# Patient Record
Sex: Female | Born: 1975 | Race: White | Hispanic: No | Marital: Married | Smoking: Former smoker
Health system: Southern US, Community
[De-identification: ages and names within clinical notes are randomized; demographics above are authoritative.]

## PROBLEM LIST (undated history)

## (undated) DIAGNOSIS — O10919 Unspecified pre-existing hypertension complicating pregnancy, unspecified trimester: Secondary | ICD-10-CM

## (undated) DIAGNOSIS — R87629 Unspecified abnormal cytological findings in specimens from vagina: Secondary | ICD-10-CM

## (undated) DIAGNOSIS — Z8619 Personal history of other infectious and parasitic diseases: Secondary | ICD-10-CM

## (undated) HISTORY — PX: KNEE SURGERY: SHX244

## (undated) HISTORY — DX: Unspecified abnormal cytological findings in specimens from vagina: R87.629

## (undated) HISTORY — DX: Personal history of other infectious and parasitic diseases: Z86.19

## (undated) HISTORY — PX: OTHER SURGICAL HISTORY: SHX169

---

## 1999-12-12 ENCOUNTER — Other Ambulatory Visit: Admission: RE | Admit: 1999-12-12 | Discharge: 1999-12-12 | Payer: Self-pay | Admitting: Obstetrics and Gynecology

## 2000-01-16 ENCOUNTER — Other Ambulatory Visit: Admission: RE | Admit: 2000-01-16 | Discharge: 2000-01-16 | Payer: Self-pay | Admitting: Obstetrics and Gynecology

## 2000-01-16 ENCOUNTER — Encounter (INDEPENDENT_AMBULATORY_CARE_PROVIDER_SITE_OTHER): Payer: Self-pay | Admitting: Specialist

## 2000-07-10 ENCOUNTER — Encounter (INDEPENDENT_AMBULATORY_CARE_PROVIDER_SITE_OTHER): Payer: Self-pay

## 2000-07-10 ENCOUNTER — Other Ambulatory Visit: Admission: RE | Admit: 2000-07-10 | Discharge: 2000-07-10 | Payer: Self-pay | Admitting: Obstetrics and Gynecology

## 2000-09-24 ENCOUNTER — Emergency Department (HOSPITAL_COMMUNITY): Admission: EM | Admit: 2000-09-24 | Discharge: 2000-09-25 | Payer: Self-pay | Admitting: Emergency Medicine

## 2000-09-25 ENCOUNTER — Encounter: Payer: Self-pay | Admitting: Emergency Medicine

## 2001-01-13 ENCOUNTER — Other Ambulatory Visit: Admission: RE | Admit: 2001-01-13 | Discharge: 2001-01-13 | Payer: Self-pay | Admitting: Obstetrics and Gynecology

## 2001-08-18 ENCOUNTER — Other Ambulatory Visit: Admission: RE | Admit: 2001-08-18 | Discharge: 2001-08-18 | Payer: Self-pay | Admitting: Obstetrics and Gynecology

## 2001-10-01 HISTORY — PX: GYNECOLOGIC CRYOSURGERY: SHX857

## 2002-01-22 ENCOUNTER — Other Ambulatory Visit: Admission: RE | Admit: 2002-01-22 | Discharge: 2002-01-22 | Payer: Self-pay | Admitting: Obstetrics and Gynecology

## 2003-04-30 ENCOUNTER — Other Ambulatory Visit: Admission: RE | Admit: 2003-04-30 | Discharge: 2003-04-30 | Payer: Self-pay | Admitting: Obstetrics and Gynecology

## 2004-05-05 ENCOUNTER — Other Ambulatory Visit: Admission: RE | Admit: 2004-05-05 | Discharge: 2004-05-05 | Payer: Self-pay | Admitting: Obstetrics and Gynecology

## 2005-01-19 ENCOUNTER — Encounter: Admission: RE | Admit: 2005-01-19 | Discharge: 2005-01-19 | Payer: Self-pay | Admitting: Obstetrics and Gynecology

## 2005-02-16 ENCOUNTER — Encounter: Admission: RE | Admit: 2005-02-16 | Discharge: 2005-02-16 | Payer: Self-pay | Admitting: Obstetrics and Gynecology

## 2005-07-19 ENCOUNTER — Other Ambulatory Visit: Admission: RE | Admit: 2005-07-19 | Discharge: 2005-07-19 | Payer: Self-pay | Admitting: Obstetrics and Gynecology

## 2005-10-25 ENCOUNTER — Other Ambulatory Visit: Admission: RE | Admit: 2005-10-25 | Discharge: 2005-10-25 | Payer: Self-pay | Admitting: Obstetrics and Gynecology

## 2010-07-17 ENCOUNTER — Ambulatory Visit: Payer: Self-pay | Admitting: Gynecology

## 2010-07-17 ENCOUNTER — Other Ambulatory Visit: Admission: RE | Admit: 2010-07-17 | Discharge: 2010-07-17 | Payer: Self-pay | Admitting: Gynecology

## 2010-12-28 ENCOUNTER — Ambulatory Visit: Payer: Self-pay | Admitting: Gynecology

## 2011-07-30 ENCOUNTER — Ambulatory Visit (INDEPENDENT_AMBULATORY_CARE_PROVIDER_SITE_OTHER): Payer: BC Managed Care – PPO | Admitting: Gynecology

## 2011-07-30 ENCOUNTER — Encounter: Payer: Self-pay | Admitting: Gynecology

## 2011-07-30 ENCOUNTER — Other Ambulatory Visit (HOSPITAL_COMMUNITY)
Admission: RE | Admit: 2011-07-30 | Discharge: 2011-07-30 | Disposition: A | Discharge status: Home / Self Care | Payer: BC Managed Care – PPO | Source: Ambulatory Visit | Attending: Gynecology | Admitting: Gynecology

## 2011-07-30 VITALS — BP 122/78 | Ht 64.0 in | Wt 138.0 lb

## 2011-07-30 DIAGNOSIS — Z01419 Encounter for gynecological examination (general) (routine) without abnormal findings: Secondary | ICD-10-CM

## 2011-07-30 DIAGNOSIS — Z131 Encounter for screening for diabetes mellitus: Secondary | ICD-10-CM

## 2011-07-30 DIAGNOSIS — Z3041 Encounter for surveillance of contraceptive pills: Secondary | ICD-10-CM

## 2011-07-30 DIAGNOSIS — Z13220 Encounter for screening for lipoid disorders: Secondary | ICD-10-CM

## 2011-07-30 DIAGNOSIS — R823 Hemoglobinuria: Secondary | ICD-10-CM

## 2011-07-30 MED ORDER — DESOGESTREL-ETHINYL ESTRADIOL 0.15-0.02/0.01 MG (21/5) PO TABS: 1.0000 | ORAL_TABLET | Freq: Every day | ORAL | Status: DC

## 2011-07-30 NOTE — Progress Notes (Signed)
Mary Buchanan [birth year] [id number]        35 y.o.  for annual exam.  Doing well without complaints.  Past medical history,surgical history, medications, allergies, family history and social history were all reviewed and documented in the EPIC chart. ROS:  Was performed and pertinent positives and negatives are included in the history.  Exam: chaperone present Filed Vitals:   07/30/11 1015  BP: 122/78   General appearance  Normal Skin grossly normal Head/Neck normal with no cervical or supraclavicular adenopathy thyroid normal, unable to palpate prior noted nodule Lungs  clear Cardiac RR, without RMG Abdominal  soft, nontender, without masses, organomegaly or hernia Breasts  examined lying and sitting without masses, retractions, discharge or axillary adenopathy.  Right nipple dimpled as always has been Pelvic  Ext/BUS/vagina  normal   Cervix  normal  Pap done  Uterus  anteverted, normal size, shape and contour, midline and mobile nontender   Adnexa  Without masses or tenderness    Anus and perineum  normal   Rectovaginal  normal sphincter tone without palpated masses or tenderness.    Assessment/Plan:  35 y.o. female for annual exam.    1. History thyroid nodule. Thyroid exam is normal today am unable to palpate the prior nodule. She is being followed actively by endocrinology and will continue to see them. 2. Right nipple dimple. Patient notes that her right nipple has always been difficult and everts normally.  We'll continue with SBE monthly and report any changes. 3. Drospirenone BCPs. I again discussed increased risk of thrombosis with smoking particularly. Her husband plans vasectomy, options to switch pills now versus continuing through the vasectomy was discussed. Patient accepts the risk of stroke heart attack DVT and I refilled her times a year. 4. Pap smear. 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 Although not previously noted she said that she did have  cryo-surgery. Pap smear last year was normal I did another Pap smear this year. Less frequent screening options were reviewed and assuming this Pap smear is normal minimal plan on a less frequent screening interval.  Baseline labs CBC lipid profile glucose urinalysis were ordered. Assuming she continues well from a gynecologic standpoint she'll see me in a year sooner as needed.    Dara Lords MD, 10:48 AM 07/30/2011

## 2011-08-02 ENCOUNTER — Other Ambulatory Visit: Payer: Self-pay | Admitting: Gynecology

## 2011-09-11 ENCOUNTER — Telehealth: Payer: Self-pay | Admitting: *Deleted

## 2011-09-11 NOTE — Telephone Encounter (Signed)
Patient called to say that she has had 2 migraine headaches in the last 5 days that has caused her to be sick and sort of get blurred vision during that time and last about 30 min.  Does not have a PCP so she wanted to get your advice on who to see? Establish with PCP? Has been on generic Mircette for sometime now, but the last two months they have changed the packaging with another manufacturer.  Could this maybe be part of why she is experiencing these now? Please advise

## 2011-09-11 NOTE — Telephone Encounter (Signed)
I would recommend an appointment with a neurologist like Dr. Vela Prose.

## 2011-09-11 NOTE — Telephone Encounter (Signed)
Patient informed.  She said that she will think about doing the referral and talk to husband and let us know if she decides to go ahead with calling for the referral.

## 2011-10-03 ENCOUNTER — Other Ambulatory Visit: Payer: Self-pay | Admitting: Neurology

## 2011-10-03 DIAGNOSIS — G4453 Primary thunderclap headache: Secondary | ICD-10-CM

## 2011-10-17 ENCOUNTER — Other Ambulatory Visit: Payer: Self-pay | Admitting: Neurology

## 2011-10-17 DIAGNOSIS — G4453 Primary thunderclap headache: Secondary | ICD-10-CM

## 2011-10-22 ENCOUNTER — Other Ambulatory Visit: Payer: BC Managed Care – PPO

## 2012-06-27 ENCOUNTER — Other Ambulatory Visit: Payer: Self-pay | Admitting: Gynecology

## 2012-08-07 ENCOUNTER — Encounter: Payer: Self-pay | Admitting: Gynecology

## 2012-08-07 ENCOUNTER — Ambulatory Visit (INDEPENDENT_AMBULATORY_CARE_PROVIDER_SITE_OTHER): Payer: BC Managed Care – PPO | Admitting: Gynecology

## 2012-08-07 VITALS — BP 110/70 | Ht 64.0 in | Wt 134.0 lb

## 2012-08-07 DIAGNOSIS — E049 Nontoxic goiter, unspecified: Secondary | ICD-10-CM

## 2012-08-07 DIAGNOSIS — Z01419 Encounter for gynecological examination (general) (routine) without abnormal findings: Secondary | ICD-10-CM

## 2012-08-07 DIAGNOSIS — Z131 Encounter for screening for diabetes mellitus: Secondary | ICD-10-CM

## 2012-08-07 DIAGNOSIS — Z309 Encounter for contraceptive management, unspecified: Secondary | ICD-10-CM

## 2012-08-07 LAB — CBC WITH DIFFERENTIAL/PLATELET
Hemoglobin: 14.1 g/dL (ref 12.0–15.0)
Lymphs Abs: 2.1 10*3/uL (ref 0.7–4.0)
Monocytes Relative: 6 % (ref 3–12)
Neutro Abs: 5.4 10*3/uL (ref 1.7–7.7)
Neutrophils Relative %: 66 % (ref 43–77)
RBC: 4.41 MIL/uL (ref 3.87–5.11)
WBC: 8.1 10*3/uL (ref 4.0–10.5)

## 2012-08-07 MED ORDER — DESOGESTREL-ETHINYL ESTRADIOL 0.15-0.02/0.01 MG (21/5) PO TABS: 1.0000 | ORAL_TABLET | Freq: Every day | ORAL | Status: DC

## 2012-08-07 NOTE — Patient Instructions (Signed)
Consider Stop Smoking.  Help is available at [facility]'s smoking cessation program @ [email or web address] or 336-832-0838. OR 1-800-QUIT-NOW (1-800-784-8669) for free smoking cessation counseling.  Smokefree.gov (http://www.smokefree.gov) provides free, accurate, evidence-based information and professional assistance to help support the immediate and long-term needs of people trying to quit smoking.    Smoking Hazards Smoking cigarettes is extremely bad for your health. Tobacco smoke has over 200 known poisons in it. There are over 60 chemicals in tobacco smoke that cause cancer. Some of the chemicals found in cigarette smoke include:  Cyanide.  Benzene.  Formaldehyde.  Methanol (wood alcohol).  Acetylene (fuel used in welding torches).  Ammonia.  Cigarette smoke also contains the poisonous gases nitrogen oxide and carbon monoxide.  Cigarette smokers have an increased risk of many serious medical problems, including: Lung cancer.  Lung disease (such as pneumonia, bronchitis, and emphysema).  Heart attack and chest pain due to the heart not getting enough oxygen (angina).  Heart disease and peripheral blood vessel disease.  Hypertension.  Stroke.  Oral cancer (cancer of the lip, mouth, or voice box).  Bladder cancer.  Pancreatic cancer.  Cervical cancer.  Pregnancy complications, including premature birth.  Low birthweight babies.  Early menopause.  Lower estrogen level for women.  Infertility.  Facial wrinkles.  Blindness.  Increased risk of broken bones (fractures).  Senile dementia.  Stillbirths and smaller newborn babies, birth defects, and genetic damage to sperm.  Stomach ulcers and internal bleeding.  Children of smokers have an increased risk of the following, because of secondhand smoke exposure:  Sudden infant death syndrome (SIDS).  Respiratory infections.  Lung cancer.  Heart disease.  Ear infections.  Smoking causes approximately: 90% of all lung cancer  deaths in men.  80% of all lung cancer deaths in women.  90% of deaths from chronic obstructive lung disease.  Compared with nonsmokers, smoking increases the risk of: Coronary heart disease by 2 to 4 times.  Stroke by 2 to 4 times.  Men developing lung cancer by 23 times.  Women developing lung cancer by 13 times.  Dying from chronic obstructive lung diseases by 12 times.  Someone who smokes 2 packs a day loses about 8 years of his or her life. Even smoking lightly shortens your life expectancy by several years. You can greatly reduce the risk of medical problems for you and your family by stopping now. Smoking is the most preventable cause of death and disease in our society. Within days of quitting smoking, your circulation returns to normal, you decrease the risk of having a heart attack, and your lung capacity improves. There may be some increased phlegm in the first few days after quitting, and it may take months for your lungs to clear up completely. Quitting for 10 years cuts your lung cancer risk to almost that of a nonsmoker. WHY IS SMOKING ADDICTIVE? Nicotine is the chemical agent in tobacco that is capable of causing addiction or dependence.  When you smoke and inhale, nicotine is absorbed rapidly into the bloodstream through your lungs. Nicotine absorbed through the lungs is capable of creating a powerful addiction. Both inhaled and non-inhaled nicotine may be addictive.  Addiction studies of cigarettes and spit tobacco show that addiction to nicotine occurs mainly during the teen years, when young people begin using tobacco products.  WHAT ARE THE BENEFITS OF QUITTING?  There are many health benefits to quitting smoking.  Likelihood of developing cancer and heart disease decreases. Health improvements are seen almost immediately.    Blood pressure, pulse rate, and breathing patterns start returning to normal soon after quitting.  People who quit may see an improvement in their overall  quality of life.  Some people choose to quit all at once. Other options include nicotine replacement products, such as patches, gum, and nasal sprays. Do not use these products without first checking with your caregiver. QUITTING SMOKING It is not easy to quit smoking. Nicotine is addicting, and longtime habits are hard to change. To start, you can write down all your reasons for quitting, tell your family and friends you want to quit, and ask for their help. Throw your cigarettes away, chew gum or cinnamon sticks, keep your hands busy, and drink extra water or juice. Go for walks and practice deep breathing to relax. Think of all the money you are saving: around $1,000 a year, for the average pack-a-day smoker. Nicotine patches and gum have been shown to improve success at efforts to stop smoking. Zyban (bupropion) is an anti-depressant drug that can be prescribed to reduce nicotine withdrawal symptoms and to suppress the urge to smoke. Smoking is an addiction with both physical and psychological effects. Joining a stop-smoking support group can help you cope with the emotional issues. For more information and advice on programs to stop smoking, call your doctor, your local hospital, or these organizations: American Lung Association - 1-800-LUNGUSA   Smoking Cessation  This document explains the best ways for you to quit smoking and new treatments to help. It lists new medicines that can double or triple your chances of quitting and quitting for good. It also considers ways to avoid relapses and concerns you may have about quitting, including weight gain. NICOTINE: A POWERFUL ADDICTION If you have tried to quit smoking, you know how hard it can be. It is hard because nicotine is a very addictive drug. For some people, it can be as addictive as heroin or cocaine. Usually, people make 2 or 3 tries, or more, before finally being able to quit. Each time you try to quit, you can learn about what helps and  what hurts. Quitting takes hard work and a lot of effort, but you can quit smoking. QUITTING SMOKING IS ONE OF THE MOST IMPORTANT THINGS YOU WILL EVER DO.  You will live longer, feel better, and live better.   The impact on your body of quitting smoking is felt almost immediately:   Within 20 minutes, blood pressure decreases. Pulse returns to its normal level.   After 8 hours, carbon monoxide levels in the blood return to normal. Oxygen level increases.   After 24 hours, chance of heart attack starts to decrease. Breath, hair, and body stop smelling like smoke.   After 48 hours, damaged nerve endings begin to recover. Sense of taste and smell improve.   After 72 hours, the body is virtually free of nicotine. Bronchial tubes relax and breathing becomes easier.   After 2 to 12 weeks, lungs can hold more air. Exercise becomes easier and circulation improves.   Quitting will reduce your risk of having a heart attack, stroke, cancer, or lung disease:   After 1 year, the risk of coronary heart disease is cut in half.   After 5 years, the risk of stroke falls to the same as a nonsmoker.   After 10 years, the risk of lung cancer is cut in half and the risk of other cancers decreases significantly.   After 15 years, the risk of coronary heart disease drops, usually   to the level of a nonsmoker.   If you are pregnant, quitting smoking will improve your chances of having a healthy baby.   The people you live with, especially your children, will be healthier.   You will have extra money to spend on things other than cigarettes.  FIVE KEYS TO QUITTING Studies have shown that these 5 steps will help you quit smoking and quit for good. You have the best chances of quitting if you use them together: 1. Get ready.  2. Get support and encouragement.  3. Learn new skills and behaviors.  4. Get medicine to reduce your nicotine addiction and use it correctly.  5. Be prepared for relapse or  difficult situations. Be determined to continue trying to quit, even if you do not succeed at first.  1. GET READY  Set a quit date.   Change your environment.   Get rid of ALL cigarettes, ashtrays, matches, and lighters in your home, car, and place of work.   Do not let people smoke in your home.   Review your past attempts to quit. Think about what worked and what did not.   Once you quit, do not smoke. NOT EVEN A PUFF!  2. GET SUPPORT AND ENCOURAGEMENT Studies have shown that you have a better chance of being successful if you have help. You can get support in many ways.  Tell your family, friends, and coworkers that you are going to quit and need their support. Ask them not to smoke around you.   Talk to your caregivers (doctor, dentist, nurse, pharmacist, psychologist, and/or smoking counselor).   Get individual, group, or telephone counseling and support. The more counseling you have, the better your chances are of quitting. Programs are available at local hospitals and health centers. Call your local health department for information about programs in your area.   Spiritual beliefs and practices may help some smokers quit.   Quit meters are small computer programs online or downloadable that keep track of quit statistics, such as amount of "quit-time," cigarettes not smoked, and money saved.   Many smokers find one or more of the many self-help books available useful in helping them quit and stay off tobacco.  3. LEARN NEW SKILLS AND BEHAVIORS  Try to distract yourself from urges to smoke. Talk to someone, go for a walk, or occupy your time with a task.   When you first try to quit, change your routine. Take a different route to work. Drink tea instead of coffee. Eat breakfast in a different place.   Do something to reduce your stress. Take a hot bath, exercise, or read a book.   Plan something enjoyable to do every day. Reward yourself for not smoking.   Explore  interactive web-based programs that specialize in helping you quit.  4. GET MEDICINE AND USE IT CORRECTLY Medicines can help you stop smoking and decrease the urge to smoke. Combining medicine with the above behavioral methods and support can quadruple your chances of successfully quitting smoking. The [place] Food and Drug Administration (FDA) has approved 7 medicines to help you quit smoking. These medicines fall into 3 categories.  Nicotine replacement therapy (delivers nicotine to your body without the negative effects and risks of smoking):   Nicotine gum: Available over-the-counter.   Nicotine lozenges: Available over-the-counter.   Nicotine inhaler: Available by prescription.   Nicotine nasal spray: Available by prescription.   Nicotine skin patches (transdermal): Available by prescription and over-the-counter.   Antidepressant medicine (  helps people abstain from smoking, but how this works is unknown):   Bupropion sustained-release (SR) tablets: Available by prescription.   Nicotinic receptor partial agonist (simulates the effect of nicotine in your brain):   Varenicline tartrate tablets: Available by prescription.   Ask your caregiver for advice about which medicines to use and how to use them. Carefully read the information on the package.   Everyone who is trying to quit may benefit from using a medicine. If you are pregnant or trying to become pregnant, nursing an infant, you are under age 18, or you smoke fewer than 10 cigarettes per day, talk to your caregiver before taking any nicotine replacement medicines.   You should stop using a nicotine replacement product and call your caregiver if you experience nausea, dizziness, weakness, vomiting, fast or irregular heartbeat, mouth problems with the lozenge or gum, or redness or swelling of the skin around the patch that does not go away.   Do not use any other product containing nicotine while using a nicotine replacement  product.   Talk to your caregiver before using these products if you have diabetes, heart disease, asthma, stomach ulcers, you had a recent heart attack, you have high blood pressure that is not controlled with medicine, a history of irregular heartbeat, or you have been prescribed medicine to help you quit smoking.  5. BE PREPARED FOR RELAPSE OR DIFFICULT SITUATIONS  Most relapses occur within the first 3 months after quitting. Do not be discouraged if you start smoking again. Remember, most people try several times before they finally quit.   You may have symptoms of withdrawal because your body is used to nicotine. You may crave cigarettes, be irritable, feel very hungry, cough often, get headaches, or have difficulty concentrating.   The withdrawal symptoms are only temporary. They are strongest when you first quit, but they will go away within 10 to 14 days.  Here are some difficult situations to watch for:  Alcohol. Avoid drinking alcohol. Drinking lowers your chances of successfully quitting.   Caffeine. Try to reduce the amount of caffeine you consume. It also lowers your chances of successfully quitting.   Other smokers. Being around smoking can make you want to smoke. Avoid smokers.   Weight gain. Many smokers will gain weight when they quit, usually less than 10 pounds. Eat a healthy diet and stay active. Do not let weight gain distract you from your main goal, quitting smoking. Some medicines that help you quit smoking may also help delay weight gain. You can always lose the weight gained after you quit.   Bad mood or depression. There are a lot of ways to improve your mood other than smoking.  If you are having problems with any of these situations, talk to your caregiver. SPECIAL SITUATIONS AND CONDITIONS Studies suggest that everyone can quit smoking. Your situation or condition can give you a special reason to quit.  Pregnant women/new mothers: By quitting, you protect your  baby's health and your own.   Hospitalized patients: By quitting, you reduce health problems and help healing.   Heart attack patients: By quitting, you reduce your risk of a second heart attack.   Lung, head, and neck cancer patients: By quitting, you reduce your chance of a second cancer.   Parents of children and adolescents: By quitting, you protect your children from illnesses caused by secondhand smoke.  QUESTIONS TO THINK ABOUT Think about the following questions before you try to stop smoking. You may   want to talk about your answers with your caregiver.  Why do you want to quit?   If you tried to quit in the past, what helped and what did not?   What will be the most difficult situations for you after you quit? How will you plan to handle them?   Who can help you through the tough times? Your family? Friends? Caregiver?   What pleasures do you get from smoking? What ways can you still get pleasure if you quit?  Here are some questions to ask your caregiver:  How can you help me to be successful at quitting?   What medicine do you think would be best for me and how should I take it?   What should I do if I need more help?   What is smoking withdrawal like? How can I get information on withdrawal?  Quitting takes hard work and a lot of effort, but you can quit smoking.  

## 2012-08-07 NOTE — Progress Notes (Signed)
Mary Buchanan Mar 01, 1976 [id number]        36 y.o.  G0P0 for annual exam.    Past medical history,surgical history, medications, allergies, family history and social history were all reviewed and documented in the EPIC chart. ROS:  Was performed and pertinent positives and negatives are included in the history.  Exam: Kim assistant Filed Vitals:   08/07/12 1032  BP: 110/70  Height: 5\' 4"  (1.626 m)  Weight: 134 lb (60.782 kg)   General appearance  Normal Skin grossly normal Head/Neck normal with no cervical or supraclavicular adenopathy thyroid normal Lungs  clear Cardiac RR, without RMG Abdominal  soft, nontender, without masses, organomegaly or hernia Breasts  examined lying and sitting without masses, retractions, discharge or axillary adenopathy.  Right nipple dimpled/reverts easily. Pelvic  Ext/BUS/vagina  normal   Cervix  normal   Uterus  anteverted, normal size, shape and contour, midline and mobile nontender   Adnexa  Without masses or tenderness    Anus and perineum  normal   Rectovaginal  normal sphincter tone without palpated masses or tenderness.    Assessment/Plan:  36 y.o. G0P0 female for annual exam.   1. Contraception. Patient on drospirenone containing birth control pill. We've discussed previously I again reviewed with her the risk in general with pills of stroke heart attack DVT and the increased risk with drospirenone containing. She also smokes and is 36. I again reviewed options to include progesterone only, Mirena IUD sterilization. Patient says husband is considering sterilization. She understands accepts risk of thrombosis I refilled her 1 more year. She understands that I will not refill her next year and that we will need to use alternative such as an IUD/progesterone only or hopefully her husband will that his vasectomy. 2. Pap smear. No Pap smear done today. Last Pap smear 2012. 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 We'll plan  every 3-5 year screening. 3. Breast health. Right nipple dimpled as always has been and the vertex easily. Continue with monthly self breast exams. Plan mammogram closer to 42. 4. Stop smoking. I reviewed the need to stop smoking and various strategies. She had tried Chantix but did not like the way she felt. She does understand and agree with the need to stop and will try to do so. 5. History of goiter. Her thyroid exam is normal. She's going to check with her endocrinologist to see what if any follow up she needs. I did check thyroid functions today. 6. Health maintenance.  Baseline CBC urinalysis and glucose done with thyroid functions. Lipid profile 2012 normal and not repeated.  Follow up one year, sooner as needed.    Dara Lords MD, 11:33 AM 08/07/2012

## 2012-08-08 LAB — T4, FREE: Free T4: 1.46 ng/dL (ref 0.80–1.80)

## 2012-08-09 LAB — URINALYSIS W MICROSCOPIC + REFLEX CULTURE
Bilirubin Urine: NEGATIVE
Glucose, UA: NEGATIVE mg/dL
Protein, ur: NEGATIVE mg/dL
Squamous Epithelial / LPF: NONE SEEN

## 2012-08-21 ENCOUNTER — Other Ambulatory Visit: Payer: Self-pay | Admitting: Gynecology

## 2013-07-23 ENCOUNTER — Other Ambulatory Visit: Payer: Self-pay | Admitting: Gynecology

## 2013-08-12 ENCOUNTER — Ambulatory Visit (INDEPENDENT_AMBULATORY_CARE_PROVIDER_SITE_OTHER): Payer: BC Managed Care – PPO | Admitting: Gynecology

## 2013-08-12 ENCOUNTER — Encounter: Payer: Self-pay | Admitting: Gynecology

## 2013-08-12 VITALS — BP 110/70 | Ht 63.0 in | Wt 127.0 lb

## 2013-08-12 DIAGNOSIS — Z716 Tobacco abuse counseling: Secondary | ICD-10-CM

## 2013-08-12 DIAGNOSIS — L989 Disorder of the skin and subcutaneous tissue, unspecified: Secondary | ICD-10-CM

## 2013-08-12 DIAGNOSIS — Z7189 Other specified counseling: Secondary | ICD-10-CM

## 2013-08-12 DIAGNOSIS — Z309 Encounter for contraceptive management, unspecified: Secondary | ICD-10-CM

## 2013-08-12 DIAGNOSIS — Z01419 Encounter for gynecological examination (general) (routine) without abnormal findings: Secondary | ICD-10-CM

## 2013-08-12 LAB — CBC WITH DIFFERENTIAL/PLATELET
Basophils Absolute: 0 10*3/uL (ref 0.0–0.1)
Basophils Relative: 1 % (ref 0–1)
Eosinophils Absolute: 0.1 10*3/uL (ref 0.0–0.7)
Eosinophils Relative: 1 % (ref 0–5)
HCT: 38.1 % (ref 36.0–46.0)
Hemoglobin: 13.4 g/dL (ref 12.0–15.0)
Lymphocytes Relative: 26 % (ref 12–46)
Lymphs Abs: 1.9 10*3/uL (ref 0.7–4.0)
MCH: 32.1 pg (ref 26.0–34.0)
MCHC: 35.2 g/dL (ref 30.0–36.0)
MCV: 91.4 fL (ref 78.0–100.0)
Monocytes Absolute: 0.6 10*3/uL (ref 0.1–1.0)
Monocytes Relative: 8 % (ref 3–12)
Neutro Abs: 4.7 10*3/uL (ref 1.7–7.7)
Neutrophils Relative %: 64 % (ref 43–77)
Platelets: 241 10*3/uL (ref 150–400)
RBC: 4.17 MIL/uL (ref 3.87–5.11)
RDW: 12.9 % (ref 11.5–15.5)
WBC: 7.3 10*3/uL (ref 4.0–10.5)

## 2013-08-12 LAB — LIPID PANEL
Cholesterol: 104 mg/dL (ref 0–200)
HDL: 82 mg/dL (ref >39)
LDL Cholesterol: 17 mg/dL (ref 0–99)
Triglycerides: 24 mg/dL (ref <150)
VLDL: 5 mg/dL (ref 0–40)

## 2013-08-12 LAB — COMPREHENSIVE METABOLIC PANEL
Alkaline Phosphatase: 43 U/L (ref 39–117)
BUN: 10 mg/dL (ref 6–23)
Creat: 0.74 mg/dL (ref 0.50–1.10)
Glucose, Bld: 83 mg/dL (ref 70–99)
Total Bilirubin: 0.4 mg/dL (ref 0.3–1.2)

## 2013-08-12 LAB — HEPATITIS A ANTIBODY, TOTAL: Hep A Total Ab: NONREACTIVE

## 2013-08-12 MED ORDER — BUPROPION HCL ER (SR) 150 MG PO TB12: 150.0000 mg | ORAL_TABLET | Freq: Every day | ORAL | Status: DC

## 2013-08-12 MED ORDER — DESOGESTREL-ETHINYL ESTRADIOL 0.15-0.02/0.01 MG (21/5) PO TABS: 1.0000 | ORAL_TABLET | Freq: Every day | ORAL | Status: DC

## 2013-08-12 NOTE — Progress Notes (Signed)
Mary Buchanan [birth year] [id number]        37 y.o.  G0P0 for annual exam.  Several issues noted below.  Past medical history,surgical history, problem list, medications, allergies, family history and social history were all reviewed and documented in the EPIC chart.  ROS:  Performed and pertinent positives and negatives are included in the history, assessment and plan .  Exam: Kim assistant Filed Vitals:   08/12/13 0900  BP: 110/70  Height: 5\' 3"  (1.6 m)  Weight: 127 lb (57.607 kg)   General appearance  Normal Skin crusting superficial skin lesion left forearm Head/Neck normal with no cervical or supraclavicular adenopathy thyroid normal Lungs  clear Cardiac RR, without RMG Abdominal  soft, nontender, without masses, organomegaly or hernia Breasts  examined lying and sitting without masses, retractions, discharge or axillary adenopathy. Right nipple dimpled as always Pelvic  Ext/BUS/vagina  normal  Cervix  normal  Uterus  anteverted, normal size, shape and contour, midline and mobile nontender   Adnexa  Without masses or tenderness    Anus and perineum  normal   Rectovaginal  normal sphincter tone without palpated masses or tenderness.    Assessment/Plan:  37 y.o. G0P0 female for annual exam, regular menses, oral contraceptives.   1. Contraceptive management. Patient on desogestrel pill. Age 32 and smoking. Reviewed increased thrombosis risk to include stroke heart attack DVT. Husband planning vasectomy in January. I refilled her time 6 months to allow her to transition through the vasectomy and then she will stop. She clearly understands and accepts the above risks. 2. Smoking cessation. Patient planning to stop smoking and asked about medication assistance. Does not feel she can do it by herself. Had tried Chantix before and had headaches and did not feel well. She asked about Wellbutrin that a friend had tried him was successful. I discussed stop smoking strategies with her to  include the use of Wellbutrin type products. Side effects and risks to include increasing anxiety, depression and suicide ideation discussed. Patient understands accepts and will start with iron 50 mg times several days and then 150 mg twice daily for 3 months. 3. Breast health. Right nipple dimpled as always. SBE monthly reviewed. Screening mammographic recommendations between 37 and 42 discussed. Breast cancer in maternal grandmother at age 12. We'll plan mammogram closer to 42. 4. Crusting lesion left forearm. Present for 3-4 months. Recommended dermatologic evaluation and I discussed possible squamous cell carcinoma with her and she agrees to arrange appointment. 5. Pap smear 2012. No Pap smear done today. History of cryosurgery 2003 with normal Pap smears afterwards. Followup Pap smear next year at three-year intervals. 6. Brother-in-law diagnosed with hepatitis A. He was asymptomatic and this was detected on routine screening comprehensive metabolic panel. Thought to be secondary to eating out at restaurants. We'll screen liver function and hepatitis A antibody in patient as she spends a lot of time with her brother-in-law. 7. Health maintenance. Baseline CBC conference of metabolic panel lipid profile urinalysis hepatitis A antibody screen ordered.  Note: This document was prepared with digital dictation and possible smart phrase technology. Any transcriptional errors that result from this process are unintentional.   Dara Lords MD, 9:30 AM 08/12/2013   \

## 2013-08-12 NOTE — Patient Instructions (Addendum)
Start on Wellbutrin medication once daily for one week and then twice daily for 3 months. Report any increasing anxiety, depression or suicide thoughts. Followup with dermatologist in reference to the skin lesion on your arm. Make sure your husband follows through with the vasectomy.

## 2013-08-13 LAB — URINALYSIS W MICROSCOPIC + REFLEX CULTURE
Bacteria, UA: NONE SEEN
Bilirubin Urine: NEGATIVE
Casts: NONE SEEN
Hgb urine dipstick: NEGATIVE
Ketones, ur: NEGATIVE mg/dL
Nitrite: NEGATIVE
Protein, ur: NEGATIVE mg/dL
Urobilinogen, UA: 0.2 mg/dL (ref 0.0–1.0)
pH: 6 (ref 5.0–8.0)

## 2013-11-19 ENCOUNTER — Ambulatory Visit: Payer: BC Managed Care – PPO | Admitting: Gynecology

## 2013-11-24 ENCOUNTER — Ambulatory Visit: Payer: BC Managed Care – PPO | Admitting: Gynecology

## 2013-11-27 ENCOUNTER — Telehealth: Payer: Self-pay | Admitting: Gynecology

## 2013-11-27 NOTE — Telephone Encounter (Signed)
11/27/13-AS THIS PT IS COMING IN FOR IUD CONSULT I CHECKED HER INS BENEFITS AND IT WOULD BE COVERED AT 100% UNDER PREVENTATIVE SVCS/WL

## 2013-12-10 ENCOUNTER — Encounter: Payer: Self-pay | Admitting: Gynecology

## 2013-12-10 ENCOUNTER — Ambulatory Visit (INDEPENDENT_AMBULATORY_CARE_PROVIDER_SITE_OTHER): Payer: BC Managed Care – PPO | Admitting: Gynecology

## 2013-12-10 DIAGNOSIS — Z309 Encounter for contraceptive management, unspecified: Secondary | ICD-10-CM

## 2013-12-10 NOTE — Progress Notes (Signed)
Patient presents to discuss contraceptive options. She is currently on the oral contraceptives and continues to smoke at age 38. Husband was planning vasectomy but they decided against this. I reviewed contraceptive options to include nexplanon, Depo-Provera, IUD and barrier. The issues of increased thrombosis risk associated with estrogen containing products discussed. She's interested in Mirena IUD. I reviewed the insertional process with her as well as the risks to include infection, either immediate or long-term, uterine perforation or migration requiring surgery to remove, other complications such as pain, hormonal side effects, infertility and possibility of failure with subsequent pregnancy. Patient wants to go ahead and schedule this and will do so when she is on her next menstrual period. Literature on the Mirena IUD was provided.

## 2013-12-10 NOTE — Patient Instructions (Signed)
Followup for scheduled IUD insertion will appointment.  Intrauterine Device Insertion Most often, an intrauterine device (IUD) is inserted into the uterus to prevent pregnancy. There are 2 types of IUDs available:  Copper IUD This type of IUD creates an environment that is not favorable to sperm survival. The mechanism of action of the copper IUD is not known for certain. It can stay in place for 10 years.  Hormone IUD This type of IUD contains the hormone progestin (synthetic progesterone). The progestin thickens the cervical mucus and prevents sperm from entering the uterus, and it also thins the uterine lining. There is no evidence that the hormone IUD prevents implantation. One hormone IUD can stay in place for up to 5 years, and a different hormone IUD can stay in place for up to 3 years. An IUD is the most cost-effective birth control if left in place for the full duration. It may be removed at any time. LET [facility] PROVID[facility] KNOW ABOUT:  Any allergies you have.  All medicines you are taking, including vitamins, herbs, eye drops, creams, and over-the-counter medicines.  Previous problems you or members of your family have had with the use of anesthetics.  Any blood disorders you have.  Previous surgeries you have had.  Possibility of pregnancy.  Medical conditions you have. RISKS AND COMPLICATIONS  Generally, intrauterine device insertion is a safe procedure. However, as with any procedure, complications can occur. Possible complications include:  Accidental puncture (perforation) of the uterus.  Accidental placement of the IUD either in the muscle layer of the uterus (myometrium) or outside the uterus. If this happens, the IUD can be found essentially floating around the bowels and must be taken out surgically.  The IUD may fall out of the uterus (expulsion). This is more common in women who have recently had a child.   Pregnancy in the fallopian tube  (ectopic).  Pelvic inflammatory disease (PID), which is infection of the uterus and fallopian tubes. The risk of PID is slightly increased in the first 20 days after the IUD is placed, but the overall risk is still very low. BEFORE THE PROCEDURE  Schedule the IUD insertion for when you will have your menstrual period or right after, to make sure you are not pregnant. Placement of the IUD is better tolerated shortly after a menstrual cycle.  You may need to take tests or be examined to make sure you are not pregnant.  You may be required to take a pregnancy test.  You may be required to get checked for sexually transmitted infections (STIs) prior to placement. Placing an IUD in someone who has an infection can make the infection worse.  You may be given a pain reliever to take 1 or 2 hours before the procedure.  An exam will be performed to determine the size and position of your uterus.  Ask your health care provider about changing or stopping your regular medicines. PROCEDURE   A tool (speculum) is placed in the vagina. This allows your health care provider to see the lower part of the uterus (cervix).  The cervix is prepped with a medicine that lowers the risk of infection.  You may be given a medicine to numb each side of the cervix (intracervical or paracervical block). This is used to block and control any discomfort with insertion.  A tool (uterine sound) is inserted into the uterus to determine the length of the uterine cavity and the direction the uterus may be tilted.  A slim instrument (IUD inserter) is inserted through the cervical canal and into your uterus.  The IUD is placed in the uterine cavity and the insertion device is removed.  The nylon string that is attached to the IUD and used for eventual IUD removal is trimmed. It is trimmed so that it lays high in the vagina, just outside the cervix. AFTER THE PROCEDURE  You may have bleeding after the procedure. This is  normal. It varies from light spotting for a few days to menstrual-like bleeding.  You may have mild cramping. Document Released: 05/16/2011 Document Revised: 07/08/2013 Document Reviewed: 03/08/2013 [facility] Patient Information 2014 [place].

## 2013-12-11 ENCOUNTER — Other Ambulatory Visit: Payer: Self-pay | Admitting: Gynecology

## 2013-12-11 ENCOUNTER — Telehealth: Payer: Self-pay | Admitting: Gynecology

## 2013-12-11 DIAGNOSIS — Z3049 Encounter for surveillance of other contraceptives: Secondary | ICD-10-CM

## 2013-12-11 MED ORDER — LEVONORGESTREL 20 MCG/24HR IU IUD: INTRAUTERINE_SYSTEM | Freq: Once | INTRAUTERINE | Status: DC

## 2013-12-11 NOTE — Telephone Encounter (Signed)
12/11/13-LM VM for pt that her ins covers the Mirena and insertion at 100%. Pt to call first day of cycle to insert.wl

## 2014-01-07 ENCOUNTER — Other Ambulatory Visit: Payer: Self-pay | Admitting: Gynecology

## 2014-01-07 DIAGNOSIS — Z3049 Encounter for surveillance of other contraceptives: Secondary | ICD-10-CM

## 2014-01-11 ENCOUNTER — Ambulatory Visit (INDEPENDENT_AMBULATORY_CARE_PROVIDER_SITE_OTHER): Payer: BC Managed Care – PPO | Admitting: Gynecology

## 2014-01-11 ENCOUNTER — Encounter: Payer: Self-pay | Admitting: Gynecology

## 2014-01-11 DIAGNOSIS — Z30430 Encounter for insertion of intrauterine contraceptive device: Secondary | ICD-10-CM

## 2014-01-11 DIAGNOSIS — N882 Stricture and stenosis of cervix uteri: Secondary | ICD-10-CM

## 2014-01-11 HISTORY — PX: INTRAUTERINE DEVICE (IUD) INSERTION: SHX5877

## 2014-01-11 NOTE — Patient Instructions (Signed)
Intrauterine Device Insertion   Most often, an intrauterine device (IUD) is inserted into the uterus to prevent pregnancy. There are 2 types of IUDs available:  · Copper IUD This type of IUD creates an environment that is not favorable to sperm survival. The mechanism of action of the copper IUD is not known for certain. It can stay in place for 10 years.  · Hormone IUD This type of IUD contains the hormone progestin (synthetic progesterone). The progestin thickens the cervical mucus and prevents sperm from entering the uterus, and it also thins the uterine lining. There is no evidence that the hormone IUD prevents implantation. One hormone IUD can stay in place for up to 5 years, and a different hormone IUD can stay in place for up to 3 years.  An IUD is the most cost-effective birth control if left in place for the full duration. It may be removed at any time.  LET YOUR HEALTH CARE PROVIDER KNOW ABOUT:  · Any allergies you have.  · All medicines you are taking, including vitamins, herbs, eye drops, creams, and over-the-counter medicines.  · Previous problems you or members of your family have had with the use of anesthetics.  · Any blood disorders you have.  · Previous surgeries you have had.  · Possibility of pregnancy.  · Medical conditions you have.  RISKS AND COMPLICATIONS   Generally, intrauterine device insertion is a safe procedure. However, as with any procedure, complications can occur. Possible complications include:  · Accidental puncture (perforation) of the uterus.  · Accidental placement of the IUD either in the muscle layer of the uterus (myometrium) or outside the uterus. If this happens, the IUD can be found essentially floating around the bowels and must be taken out surgically.  · The IUD may fall out of the uterus (expulsion). This is more common in women who have recently had a child.    · Pregnancy in the fallopian tube (ectopic).  · Pelvic inflammatory disease (PID), which is infection of  the uterus and fallopian tubes. The risk of PID is slightly increased in the first 20 days after the IUD is placed, but the overall risk is still very low.  BEFORE THE PROCEDURE  · Schedule the IUD insertion for when you will have your menstrual period or right after, to make sure you are not pregnant. Placement of the IUD is better tolerated shortly after a menstrual cycle.  · You may need to take tests or be examined to make sure you are not pregnant.  · You may be required to take a pregnancy test.  · You may be required to get checked for sexually transmitted infections (STIs) prior to placement. Placing an IUD in someone who has an infection can make the infection worse.  · You may be given a pain reliever to take 1 or 2 hours before the procedure.  · An exam will be performed to determine the size and position of your uterus.  · Ask your health care provider about changing or stopping your regular medicines.  PROCEDURE   · A tool (speculum) is placed in the vagina. This allows your health care provider to see the lower part of the uterus (cervix).  · The cervix is prepped with a medicine that lowers the risk of infection.  · You may be given a medicine to numb each side of the cervix (intracervical or paracervical block). This is used to block and control any discomfort with insertion.  · A tool (  uterine sound) is inserted into the uterus to determine the length of the uterine cavity and the direction the uterus may be tilted.  · A slim instrument (IUD inserter) is inserted through the cervical canal and into your uterus.  · The IUD is placed in the uterine cavity and the insertion device is removed.  · The nylon string that is attached to the IUD and used for eventual IUD removal is trimmed. It is trimmed so that it lays high in the vagina, just outside the cervix.  AFTER THE PROCEDURE  · You may have bleeding after the procedure. This is normal. It varies from light spotting for a few days to menstrual-like  bleeding.   · You may have mild cramping.  Document Released: 05/16/2011 Document Revised: 07/08/2013 Document Reviewed: 03/08/2013  [employer]® Patient Information ©2014 [employer].

## 2014-01-11 NOTE — Progress Notes (Signed)
Patient presents for Mirena IUD placement. She has read through the booklet, has no contraindications and signed the consent form. She currently is on a normal menses.  I again reviewed the insertional process with her as well as the risks to include infection, either immediate or long-term, uterine perforation or migration requiring surgery to remove, other complications such as pain, hormonal side effects, infertility and possibility of failure with subsequent pregnancy.   Exam with Sherrilyn RistKari assistant Pelvic: External BUS vagina normal. Cervix normal with slight menstrual flow. Uterus anteverted normal size shape contour midline mobile nontender. Adnexa without masses or tenderness.  Procedure: The cervix was cleansed with Betadine, anterior lip grasped with a single-tooth tenaculum, cervix was found to be slightly stenotic and was gently dilated with the gradated disposable dilator. The uterus was sounded but initial attempts to place a Mirena IUD felt resistance at the internal cervical os. Subsequently a paracervical block, 1% lidocaine, total 8 cc was placed and the cervix was dilated to the diameter of the Mirena IUD applier and subsequently the Mirena IUD was placed according to manufacturer's recommendations without difficulty. The strings were trimmed. The patient tolerated well and will follow up in one month for a postinsertional check.  Lot number:  [id number]  Note: This document was prepared with digital dictation and possible smart phrase technology. Any transcriptional errors that result from this process are unintentional.  Dara Lordsimothy P Carleigh Buccieri MD, 12:23 PM 01/11/2014

## 2014-01-13 ENCOUNTER — Encounter: Payer: Self-pay | Admitting: Gynecology

## 2014-02-10 ENCOUNTER — Ambulatory Visit (INDEPENDENT_AMBULATORY_CARE_PROVIDER_SITE_OTHER): Payer: BC Managed Care – PPO | Admitting: Gynecology

## 2014-02-10 ENCOUNTER — Encounter: Payer: Self-pay | Admitting: Gynecology

## 2014-02-10 DIAGNOSIS — Z30431 Encounter for routine checking of intrauterine contraceptive device: Secondary | ICD-10-CM

## 2014-02-10 NOTE — Patient Instructions (Signed)
Followup in November 2015 for annual exam, sooner if any issues.

## 2014-02-10 NOTE — Progress Notes (Signed)
Mary Buchanan 20-Jun-1976 [id number]        38 y.o.  G0P0 IUD followup exam. Has done well with occasional spotting.  Past medical history,surgical history, problem list, medications, allergies, family history and social history were all reviewed and documented in the EPIC chart.  Directed ROS with pertinent positives and negatives documented in the history of present illness/assessment and plan.  Exam: Kim assistant General appearance  Normal Abdomen soft nontender without masses guarding rebound Pelvic external BUS vagina with slight spotting. Cervix normal with IUD strings visualized and appropriate length. Uterus normal size midline mobile nontender. Adnexa without masses or tenderness.  Assessment/Plan:  38 y.o. G0P0 with normal IUD check up. Assuming she does well and she'll see us in November 2015 for annual exam, sooner if any issues.   Note: This document was prepared with digital dictation and possible smart phrase technology. Any transcriptional errors that result from this process are unintentional.   Dara Lordsimothy P Clotilde Loth MD, 8:18 AM 02/10/2014

## 2014-03-11 ENCOUNTER — Encounter: Payer: Self-pay | Admitting: Gynecology

## 2014-03-11 NOTE — Telephone Encounter (Signed)
Stopping your menses with an IUD can be fairly abrupt. I would check a UPT just for completeness but assuming negative and just monitor and you may not have another menses or have them less frequent.

## 2014-10-11 ENCOUNTER — Encounter: Payer: Self-pay | Admitting: Gynecology

## 2014-10-11 ENCOUNTER — Ambulatory Visit (INDEPENDENT_AMBULATORY_CARE_PROVIDER_SITE_OTHER): Payer: BLUE CROSS/BLUE SHIELD | Admitting: Gynecology

## 2014-10-11 ENCOUNTER — Other Ambulatory Visit (HOSPITAL_COMMUNITY)
Admission: RE | Admit: 2014-10-11 | Discharge: 2014-10-11 | Disposition: A | Discharge status: Home / Self Care | Payer: BLUE CROSS/BLUE SHIELD | Source: Ambulatory Visit | Attending: Gynecology | Admitting: Gynecology

## 2014-10-11 VITALS — BP 110/70 | Ht 63.25 in | Wt 137.8 lb

## 2014-10-11 DIAGNOSIS — Z01411 Encounter for gynecological examination (general) (routine) with abnormal findings: Secondary | ICD-10-CM | POA: Insufficient documentation

## 2014-10-11 DIAGNOSIS — Z01419 Encounter for gynecological examination (general) (routine) without abnormal findings: Secondary | ICD-10-CM

## 2014-10-11 DIAGNOSIS — N9089 Other specified noninflammatory disorders of vulva and perineum: Secondary | ICD-10-CM

## 2014-10-11 DIAGNOSIS — Z1151 Encounter for screening for human papillomavirus (HPV): Secondary | ICD-10-CM | POA: Insufficient documentation

## 2014-10-11 DIAGNOSIS — Z30431 Encounter for routine checking of intrauterine contraceptive device: Secondary | ICD-10-CM

## 2014-10-11 LAB — CBC WITH DIFFERENTIAL/PLATELET
Basophils Absolute: 0 10*3/uL (ref 0.0–0.1)
Basophils Relative: 0 % (ref 0–1)
Eosinophils Absolute: 0.1 10*3/uL (ref 0.0–0.7)
Eosinophils Relative: 1 % (ref 0–5)
HCT: 40.8 % (ref 36.0–46.0)
Hemoglobin: 13.7 g/dL (ref 12.0–15.0)
LYMPHS ABS: 1.7 10*3/uL (ref 0.7–4.0)
Lymphocytes Relative: 24 % (ref 12–46)
MCH: 31.6 pg (ref 26.0–34.0)
MCHC: 33.6 g/dL (ref 30.0–36.0)
MCV: 94 fL (ref 78.0–100.0)
MONOS PCT: 7 % (ref 3–12)
MPV: 10 fL (ref 8.6–12.4)
Monocytes Absolute: 0.5 10*3/uL (ref 0.1–1.0)
NEUTROS ABS: 4.9 10*3/uL (ref 1.7–7.7)
NEUTROS PCT: 68 % (ref 43–77)
Platelets: 219 10*3/uL (ref 150–400)
RBC: 4.34 MIL/uL (ref 3.87–5.11)
RDW: 13.1 % (ref 11.5–15.5)
WBC: 7.2 10*3/uL (ref 4.0–10.5)

## 2014-10-11 LAB — LIPID PANEL
CHOLESTEROL: 97 mg/dL (ref 0–200)
HDL: 78 mg/dL (ref >39)
LDL CALC: 16 mg/dL (ref 0–99)
TRIGLYCERIDES: 17 mg/dL (ref <150)
Total CHOL/HDL Ratio: 1.2 Ratio
VLDL: 3 mg/dL (ref 0–40)

## 2014-10-11 LAB — COMPREHENSIVE METABOLIC PANEL
ALT: 16 U/L (ref 0–35)
AST: 16 U/L (ref 0–37)
Albumin: 4.5 g/dL (ref 3.5–5.2)
Alkaline Phosphatase: 48 U/L (ref 39–117)
BUN: 8 mg/dL (ref 6–23)
CO2: 24 meq/L (ref 19–32)
Calcium: 9.2 mg/dL (ref 8.4–10.5)
Chloride: 106 mEq/L (ref 96–112)
Creat: 0.59 mg/dL (ref 0.50–1.10)
Glucose, Bld: 80 mg/dL (ref 70–99)
Potassium: 4 mEq/L (ref 3.5–5.3)
Sodium: 138 mEq/L (ref 135–145)
Total Bilirubin: 0.6 mg/dL (ref 0.2–1.2)
Total Protein: 6.5 g/dL (ref 6.0–8.3)

## 2014-10-11 NOTE — Patient Instructions (Signed)
Office will call you with biopsy results.  You may obtain a copy of any labs that were done today by logging onto MyChart as outlined in the instructions provided with your AVS (after visit summary). The office will not call with normal lab results but certainly if there are any significant abnormalities then we will contact you.   Health Maintenance, Female A healthy lifestyle and preventative care can promote health and wellness.  Maintain regular health, dental, and eye exams.  Eat a healthy diet. Foods like vegetables, fruits, whole grains, low-fat dairy products, and lean protein foods contain the nutrients you need without too many calories. Decrease your intake of foods high in solid fats, added sugars, and salt. Get information about a proper diet from your caregiver, if necessary.  Regular physical exercise is one of the most important things you can do for your health. Most adults should get at least 150 minutes of moderate-intensity exercise (any activity that increases your heart rate and causes you to sweat) each week. In addition, most adults need muscle-strengthening exercises on 2 or more days a week.   Maintain a healthy weight. The body mass index (BMI) is a screening tool to identify possible weight problems. It provides an estimate of body fat based on height and weight. Your caregiver can help determine your BMI, and can help you achieve or maintain a healthy weight. For adults 20 years and older:  A BMI below 18.5 is considered underweight.  A BMI of 18.5 to 24.9 is normal.  A BMI of 25 to 29.9 is considered overweight.  A BMI of 30 and above is considered obese.  Maintain normal blood lipids and cholesterol by exercising and minimizing your intake of saturated fat. Eat a balanced diet with plenty of fruits and vegetables. Blood tests for lipids and cholesterol should begin at age 20 and be repeated every 5 years. If your lipid or cholesterol levels are high, you are  over 50, or you are a high risk for heart disease, you may need your cholesterol levels checked more frequently.Ongoing high lipid and cholesterol levels should be treated with medicines if diet and exercise are not effective.  If you smoke, find out from your caregiver how to quit. If you do not use tobacco, do not start.  Lung cancer screening is recommended for adults aged 55 80 years who are at high risk for developing lung cancer because of a history of smoking. Yearly low-dose computed tomography (CT) is recommended for people who have at least a 30-pack-year history of smoking and are a current smoker or have quit within the past 15 years. A pack year of smoking is smoking an average of 1 pack of cigarettes a day for 1 year (for example: 1 pack a day for 30 years or 2 packs a day for 15 years). Yearly screening should continue until the smoker has stopped smoking for at least 15 years. Yearly screening should also be stopped for people who develop a health problem that would prevent them from having lung cancer treatment.  If you are pregnant, do not drink alcohol. If you are breastfeeding, be very cautious about drinking alcohol. If you are not pregnant and choose to drink alcohol, do not exceed 1 drink per day. One drink is considered to be 12 ounces (355 mL) of beer, 5 ounces (148 mL) of wine, or 1.5 ounces (44 mL) of liquor.  Avoid use of street drugs. Do not share needles with anyone. Ask for help   if you need support or instructions about stopping the use of drugs.  High blood pressure causes heart disease and increases the risk of stroke. Blood pressure should be checked at least every 1 to 2 years. Ongoing high blood pressure should be treated with medicines, if weight loss and exercise are not effective.  If you are 55 to 37 years old, ask your caregiver if you should take aspirin to prevent strokes.  Diabetes screening involves taking a blood sample to check your fasting blood sugar level.  This should be done once every 3 years, after age 45, if you are within normal weight and without risk factors for diabetes. Testing should be considered at a younger age or be carried out more frequently if you are overweight and have at least 1 risk factor for diabetes.  Breast cancer screening is essential preventative care for women. You should practice "breast self-awareness." This means understanding the normal appearance and feel of your breasts and may include breast self-examination. Any changes detected, no matter how small, should be reported to a caregiver. Women in their 20s and 30s should have a clinical breast exam (CBE) by a caregiver as part of a regular health exam every 1 to 3 years. After age 40, women should have a CBE every year. Starting at age 40, women should consider having a mammogram (breast X-ray) every year. Women who have a family history of breast cancer should talk to their caregiver about genetic screening. Women at a high risk of breast cancer should talk to their caregiver about having an MRI and a mammogram every year.  Breast cancer gene (BRCA)-related cancer risk assessment is recommended for women who have family members with BRCA-related cancers. BRCA-related cancers include breast, ovarian, tubal, and peritoneal cancers. Having family members with these cancers may be associated with an increased risk for harmful changes (mutations) in the breast cancer genes BRCA1 and BRCA2. Results of the assessment will determine the need for genetic counseling and BRCA1 and BRCA2 testing.  The Pap test is a screening test for cervical cancer. Women should have a Pap test starting at age 21. Between ages 21 and 29, Pap tests should be repeated every 2 years. Beginning at age 30, you should have a Pap test every 3 years as long as the past 3 Pap tests have been normal. If you had a hysterectomy for a problem that was not cancer or a condition that could lead to cancer, then you no  longer need Pap tests. If you are between ages 65 and 70, and you have had normal Pap tests going back 10 years, you no longer need Pap tests. If you have had past treatment for cervical cancer or a condition that could lead to cancer, you need Pap tests and screening for cancer for at least 20 years after your treatment. If Pap tests have been discontinued, risk factors (such as a new sexual partner) need to be reassessed to determine if screening should be resumed. Some women have medical problems that increase the chance of getting cervical cancer. In these cases, your caregiver may recommend more frequent screening and Pap tests.  The human papillomavirus (HPV) test is an additional test that may be used for cervical cancer screening. The HPV test looks for the virus that can cause the cell changes on the cervix. The cells collected during the Pap test can be tested for HPV. The HPV test could be used to screen women aged 30 years and older, and   should be used in women of any age who have unclear Pap test results. After the age of 7, women should have HPV testing at the same frequency as a Pap test.  Colorectal cancer can be detected and often prevented. Most routine colorectal cancer screening begins at the age of 20 and continues through age 36. However, your caregiver may recommend screening at an earlier age if you have risk factors for colon cancer. On a yearly basis, your caregiver may provide home test kits to check for hidden blood in the stool. Use of a small camera at the end of a tube, to directly examine the colon (sigmoidoscopy or colonoscopy), can detect the earliest forms of colorectal cancer. Talk to your caregiver about this at age 46, when routine screening begins. Direct examination of the colon should be repeated every 5 to 10 years through age 62, unless early forms of pre-cancerous polyps or small growths are found.  Hepatitis C blood testing is recommended for all people born  from [birth year] through 1963 and any individual with known risks for hepatitis C.  Practice safe sex. Use condoms and avoid high-risk sexual practices to reduce the spread of sexually transmitted infections (STIs). Sexually active women aged 55 and younger should be checked for Chlamydia, which is a common sexually transmitted infection. Older women with new or multiple partners should also be tested for Chlamydia. Testing for other STIs is recommended if you are sexually active and at increased risk.  Osteoporosis is a disease in which the bones lose minerals and strength with aging. This can result in serious bone fractures. The risk of osteoporosis can be identified using a bone density scan. Women ages 31 and over and women at risk for fractures or osteoporosis should discuss screening with their caregivers. Ask your caregiver whether you should be taking a calcium supplement or vitamin D to reduce the rate of osteoporosis.  Menopause can be associated with physical symptoms and risks. Hormone replacement therapy is available to decrease symptoms and risks. You should talk to your caregiver about whether hormone replacement therapy is right for you.  Use sunscreen. Apply sunscreen liberally and repeatedly throughout the day. You should seek shade when your shadow is shorter than you. Protect yourself by wearing long sleeves, pants, a wide-brimmed hat, and sunglasses year round, whenever you are outdoors.  Notify your caregiver of new moles or changes in moles, especially if there is a change in shape or color. Also notify your caregiver if a mole is larger than the size of a pencil eraser.  Stay current with your immunizations. Document Released: 04/02/2011 Document Revised: 01/12/2013 Document Reviewed: 04/02/2011 [facility] Patient Information 2014 [place].

## 2014-10-11 NOTE — Addendum Note (Signed)
Addended by: Richardson ChiquitoWILKINSON, KARI S on: 10/11/2014 01:07 PM   Modules accepted: Orders

## 2014-10-11 NOTE — Progress Notes (Signed)
Mary Buchanan 03/15/1976 [id number]        39 y.o.  G0P0 for annual exam.  Several issues noted below.  Past medical history,surgical history, problem list, medications, allergies, family history and social history were all reviewed and documented as reviewed in the EPIC chart.  ROS:  Performed with pertinent positives and negatives included in the history, assessment and plan.   Additional significant findings :  none   Exam: Product managerKari assistant Filed Vitals:   10/11/14 1204  BP: 110/70  Height: 5' 3.25" (1.607 m)  Weight: 137 lb 12.8 oz (62.506 kg)   General appearance:  Normal affect, orientation and appearance. Skin: Grossly normal HEENT: Without gross lesions.  No cervical or supraclavicular adenopathy. Thyroid normal.  Lungs:  Clear without wheezing, rales or rhonchi Cardiac: RR, without RMG Abdominal:  Soft, nontender, without masses, guarding, rebound, organomegaly or hernia Breasts:  Examined lying and sitting without masses, retractions, discharge or axillary adenopathy. Pelvic:  Ext/BUS/vagina with raised lesion approximately 5 mm upper right labia majora Physical Exam  Genitourinary:        Cervix normal with IUD strings visualized. Pap/HPV  Uterus anteverted, normal size, shape and contour, midline and mobile nontender   Adnexa  Without masses or tenderness    Anus and perineum  Normal   Rectovaginal  Normal sphincter tone without palpated masses or tenderness.   Procedure: The skin overlying/surrounding the raised lesion was cleansed with Betadine, infiltrated with 1% lidocaine and the lesion was excised in its entirety and sent to pathology. Silver nitrate applied afterwards. Band-Aid applied with good hemostasis   Assessment/Plan:  39 y.o. G0P0 female for annual exam Without menses, Mirena IUD.   1. Mirena IUD  12/2013. Patient doing well without menses or complaints. IUD strings visualized. Continue to monitor. 2. Raised lesion right upper labia majora/lower  mons. Noticed for months and seems to be getting larger.  Options reviewed to include observation, treatment or excision. Patient and I both agree on excision for removal and diagnoses. Questionable condyloma versus papilloma. Area was excised completely as above. Patient will follow up for pathology results in several days. 3. Pap smear 2012. Pap/HPV today. History of mild dysplasia status post cryosurgery 2003 with normal Pap smears afterwards. 4. Mammography. I reviewed screening mammographic recommendations between 35 and 40. She does have a postmenopausal maternal grandmother with breast cancer but no other family members. Patient will decide when she wants to do this. SBE monthly reviewed. 5. Health maintenance. Baseline CBC comprehensive metabolic panel lipid profile urinalysis ordered. Follow up for biopsy results otherwise annually.     Dara LordsFONTAINE,TIMOTHY P MD, 12:36 PM 10/11/2014

## 2014-10-12 LAB — URINALYSIS W MICROSCOPIC + REFLEX CULTURE
BACTERIA UA: NONE SEEN
Bilirubin Urine: NEGATIVE
CRYSTALS: NONE SEEN
Casts: NONE SEEN
GLUCOSE, UA: NEGATIVE mg/dL
Hgb urine dipstick: NEGATIVE
KETONES UR: NEGATIVE mg/dL
Leukocytes, UA: NEGATIVE
NITRITE: NEGATIVE
Protein, ur: NEGATIVE mg/dL
Specific Gravity, Urine: <1.005 — ABNORMAL LOW (ref 1.005–1.030)
Squamous Epithelial / LPF: NONE SEEN
UROBILINOGEN UA: 0.2 mg/dL (ref 0.0–1.0)
pH: 7 (ref 5.0–8.0)

## 2014-10-13 LAB — CYTOLOGY - PAP

## 2014-12-07 ENCOUNTER — Ambulatory Visit (INDEPENDENT_AMBULATORY_CARE_PROVIDER_SITE_OTHER): Payer: BLUE CROSS/BLUE SHIELD | Admitting: Gynecology

## 2014-12-07 ENCOUNTER — Encounter: Payer: Self-pay | Admitting: Gynecology

## 2014-12-07 VITALS — BP 120/76

## 2014-12-07 DIAGNOSIS — Z30432 Encounter for removal of intrauterine contraceptive device: Secondary | ICD-10-CM | POA: Diagnosis not present

## 2014-12-07 NOTE — Progress Notes (Signed)
Mary Buchanan 1976/08/30 [id number]        39 y.o.  G0P0 Presents to have her IUD removed. Her and her husband are planning for pregnancy.  Has stopped smoking for 3 months.  Past medical history,surgical history, problem list, medications, allergies, family history and social history were all reviewed and documented in the EPIC chart.  Directed ROS with pertinent positives and negatives documented in the history of present illness/assessment and plan.  Exam: Kim assistant Filed Vitals:   12/07/14 1117  BP: 120/76   General appearance:  Normal External BUS vagina normal. Cervix normal with IUD string visualized. Uterus anteverted normal size midline mobile nontender. Adnexa without masses or tenderness.  Procedure: Her IUD string was grasped with a [place] forcep and her Mirena IUD was removed, shown to the patient and discarded.  Assessment/Plan:  39 y.o. G0P0 for IUD removal. I asked patient use backup contraception through her first menses to allow shedding of the endometrium. Recommended she start on a prenatal vitamin daily. Congratulations on stop smoking. Follow up when she think she is pregnant for pregnancy verification. Issues of increased risk of chromosomal abnormalities secondary to maternal age with options for prenatal diagnoses reviewed in general. Patient understands that we do not deliver and when she achieves pregnancy she will establish care with an obstetrical group and they will also discuss this and offer testing to her.     Dara LordsFONTAINE,Drevin Ortner P MD, 11:32 AM 12/07/2014

## 2014-12-07 NOTE — Patient Instructions (Signed)
Start on a prenatal vitamin daily Congratulations on stopping smoking Follow up when you think you are pregnant for verification.

## 2015-06-13 ENCOUNTER — Telehealth: Payer: Self-pay

## 2015-06-13 NOTE — Telephone Encounter (Signed)
Postive home UPT. Patient asked if you would want to see her first and then refer her out or just go ahead and refer her to OB?  What to rec?

## 2015-06-14 NOTE — Telephone Encounter (Signed)
I really do not know of a bad obstetrical group in town. I usually recommend [facility], [facility] or [facility].  As they are all conveniently close to the hospital

## 2015-06-14 NOTE — Telephone Encounter (Signed)
If she is having no issues then she can call and obstetrical group and make her new OB appointment

## 2015-06-14 NOTE — Telephone Encounter (Signed)
She wanted you to recommend OB group?

## 2015-06-14 NOTE — Telephone Encounter (Signed)
Patient advised.

## 2015-07-06 LAB — OB RESULTS CONSOLE ANTIBODY SCREEN: Antibody Screen: NEGATIVE

## 2015-07-06 LAB — OB RESULTS CONSOLE GC/CHLAMYDIA
Chlamydia: NEGATIVE
Gonorrhea: NEGATIVE

## 2015-07-06 LAB — OB RESULTS CONSOLE HEPATITIS B SURFACE ANTIGEN: HEP B S AG: NEGATIVE

## 2015-07-06 LAB — OB RESULTS CONSOLE ABO/RH: RH TYPE: POSITIVE

## 2015-07-06 LAB — OB RESULTS CONSOLE RUBELLA ANTIBODY, IGM: Rubella: IMMUNE

## 2015-07-06 LAB — OB RESULTS CONSOLE RPR: RPR: NONREACTIVE

## 2015-07-06 LAB — OB RESULTS CONSOLE HIV ANTIBODY (ROUTINE TESTING): HIV: NONREACTIVE

## 2015-10-13 ENCOUNTER — Encounter: Payer: BLUE CROSS/BLUE SHIELD | Admitting: Gynecology

## 2016-01-30 ENCOUNTER — Inpatient Hospital Stay (HOSPITAL_COMMUNITY)
Admission: AD | Admit: 2016-01-30 | Payer: BLUE CROSS/BLUE SHIELD | Source: Ambulatory Visit | Admitting: Obstetrics and Gynecology

## 2016-02-09 ENCOUNTER — Encounter (HOSPITAL_COMMUNITY): Payer: Self-pay | Admitting: *Deleted

## 2016-02-09 ENCOUNTER — Telehealth (HOSPITAL_COMMUNITY): Payer: Self-pay | Admitting: *Deleted

## 2016-02-09 LAB — OB RESULTS CONSOLE GBS: GBS: POSITIVE

## 2016-02-09 NOTE — Telephone Encounter (Signed)
Preadmission screen  

## 2016-02-10 ENCOUNTER — Encounter (HOSPITAL_COMMUNITY): Payer: Self-pay | Admitting: *Deleted

## 2016-02-13 ENCOUNTER — Other Ambulatory Visit: Payer: Self-pay | Admitting: Obstetrics and Gynecology

## 2016-02-14 ENCOUNTER — Inpatient Hospital Stay (HOSPITAL_COMMUNITY): Payer: BLUE CROSS/BLUE SHIELD | Admitting: Anesthesiology

## 2016-02-14 ENCOUNTER — Inpatient Hospital Stay (HOSPITAL_COMMUNITY)
Admission: RE | Admit: 2016-02-14 | Discharge: 2016-02-18 | DRG: 766 | Disposition: A | Discharge status: Home / Self Care | Payer: BLUE CROSS/BLUE SHIELD | Source: Ambulatory Visit | Attending: Obstetrics and Gynecology | Admitting: Obstetrics and Gynecology

## 2016-02-14 ENCOUNTER — Encounter (HOSPITAL_COMMUNITY)
Admission: RE | Disposition: A | Discharge status: Home / Self Care | Payer: Self-pay | Source: Ambulatory Visit | Attending: Obstetrics and Gynecology

## 2016-02-14 ENCOUNTER — Encounter (HOSPITAL_COMMUNITY): Payer: Self-pay

## 2016-02-14 DIAGNOSIS — O1002 Pre-existing essential hypertension complicating childbirth: Principal | ICD-10-CM | POA: Diagnosis present

## 2016-02-14 DIAGNOSIS — O99824 Streptococcus B carrier state complicating childbirth: Secondary | ICD-10-CM | POA: Diagnosis present

## 2016-02-14 DIAGNOSIS — Z3A40 40 weeks gestation of pregnancy: Secondary | ICD-10-CM

## 2016-02-14 DIAGNOSIS — Z98891 History of uterine scar from previous surgery: Secondary | ICD-10-CM

## 2016-02-14 DIAGNOSIS — Z8249 Family history of ischemic heart disease and other diseases of the circulatory system: Secondary | ICD-10-CM | POA: Diagnosis not present

## 2016-02-14 DIAGNOSIS — Z3490 Encounter for supervision of normal pregnancy, unspecified, unspecified trimester: Secondary | ICD-10-CM

## 2016-02-14 DIAGNOSIS — Z87891 Personal history of nicotine dependence: Secondary | ICD-10-CM | POA: Diagnosis not present

## 2016-02-14 HISTORY — DX: Unspecified pre-existing hypertension complicating pregnancy, unspecified trimester: O10.919

## 2016-02-14 LAB — CBC
HCT: 33.9 % — ABNORMAL LOW (ref 36.0–46.0)
Hemoglobin: 12 g/dL (ref 12.0–15.0)
MCH: 32.3 pg (ref 26.0–34.0)
MCHC: 35.4 g/dL (ref 30.0–36.0)
MCV: 91.4 fL (ref 78.0–100.0)
Platelets: 224 10*3/uL (ref 150–400)
RBC: 3.71 MIL/uL — ABNORMAL LOW (ref 3.87–5.11)
RDW: 14 % (ref 11.5–15.5)
WBC: 13.3 10*3/uL — ABNORMAL HIGH (ref 4.0–10.5)

## 2016-02-14 LAB — ABO/RH: ABO/RH(D): O POS

## 2016-02-14 LAB — TYPE AND SCREEN
ABO/RH(D): O POS
Antibody Screen: NEGATIVE

## 2016-02-14 LAB — RPR: RPR: NONREACTIVE

## 2016-02-14 SURGERY — Surgical Case
Anesthesia: Regional

## 2016-02-14 MED ORDER — KETOROLAC TROMETHAMINE 30 MG/ML IJ SOLN
INTRAMUSCULAR | Status: AC
  Filled 2016-02-14: qty 1

## 2016-02-14 MED ORDER — NALOXONE HCL 2 MG/2ML IJ SOSY: 1.0000 ug/kg/h | PREFILLED_SYRINGE | INTRAMUSCULAR | Status: DC | PRN

## 2016-02-14 MED ORDER — DIBUCAINE 1 % RE OINT: 1.0000 "application " | TOPICAL_OINTMENT | RECTAL | Status: DC | PRN

## 2016-02-14 MED ORDER — KETOROLAC TROMETHAMINE 30 MG/ML IJ SOLN
30.0000 mg | Freq: Four times a day (QID) | INTRAMUSCULAR | Status: AC | PRN
  Administered 2016-02-15 (×2): 30 mg via INTRAVENOUS
  Filled 2016-02-14 (×2): qty 1

## 2016-02-14 MED ORDER — MORPHINE SULFATE (PF) 0.5 MG/ML IJ SOLN
INTRAMUSCULAR | Status: AC
  Filled 2016-02-14: qty 10

## 2016-02-14 MED ORDER — ACETAMINOPHEN 325 MG PO TABS: 650.0000 mg | ORAL_TABLET | ORAL | Status: DC | PRN

## 2016-02-14 MED ORDER — OXYTOCIN 40 UNITS IN LACTATED RINGERS INFUSION - SIMPLE MED: 2.5000 [IU]/h | INTRAVENOUS | Status: AC

## 2016-02-14 MED ORDER — SODIUM BICARBONATE 8.4 % IV SOLN
INTRAVENOUS | Status: DC | PRN
  Administered 2016-02-14 (×4): 5 mL via EPIDURAL

## 2016-02-14 MED ORDER — LIDOCAINE HCL (PF) 1 % IJ SOLN: 30.0000 mL | INTRAMUSCULAR | Status: DC | PRN

## 2016-02-14 MED ORDER — DIPHENHYDRAMINE HCL 50 MG/ML IJ SOLN
INTRAMUSCULAR | Status: DC | PRN
  Administered 2016-02-14: 25 mg via INTRAVENOUS

## 2016-02-14 MED ORDER — PENICILLIN G POTASSIUM 5000000 UNITS IJ SOLR
5.0000 10*6.[IU] | Freq: Once | INTRAVENOUS | Status: AC
  Administered 2016-02-14: 5 10*6.[IU] via INTRAVENOUS
  Filled 2016-02-14: qty 5

## 2016-02-14 MED ORDER — LACTATED RINGERS IV SOLN: 500.0000 mL | INTRAVENOUS | Status: DC | PRN

## 2016-02-14 MED ORDER — SCOPOLAMINE 1 MG/3DAYS TD PT72
MEDICATED_PATCH | TRANSDERMAL | Status: DC | PRN
  Administered 2016-02-14: 1 via TRANSDERMAL

## 2016-02-14 MED ORDER — PHENYLEPHRINE 40 MCG/ML (10ML) SYRINGE FOR IV PUSH (FOR BLOOD PRESSURE SUPPORT)
80.0000 ug | PREFILLED_SYRINGE | INTRAVENOUS | Status: DC | PRN
  Filled 2016-02-14: qty 10

## 2016-02-14 MED ORDER — ONDANSETRON HCL 4 MG/2ML IJ SOLN: 4.0000 mg | Freq: Three times a day (TID) | INTRAMUSCULAR | Status: DC | PRN

## 2016-02-14 MED ORDER — DEXAMETHASONE SODIUM PHOSPHATE 4 MG/ML IJ SOLN
INTRAMUSCULAR | Status: AC
  Filled 2016-02-14: qty 1

## 2016-02-14 MED ORDER — OXYTOCIN 40 UNITS IN LACTATED RINGERS INFUSION - SIMPLE MED: 2.5000 [IU]/h | INTRAVENOUS | Status: DC

## 2016-02-14 MED ORDER — OXYCODONE HCL 5 MG PO TABS: 10.0000 mg | ORAL_TABLET | ORAL | Status: DC | PRN

## 2016-02-14 MED ORDER — SIMETHICONE 80 MG PO CHEW
80.0000 mg | CHEWABLE_TABLET | ORAL | Status: DC
  Administered 2016-02-16 – 2016-02-17 (×3): 80 mg via ORAL
  Filled 2016-02-14 (×4): qty 1

## 2016-02-14 MED ORDER — SODIUM CHLORIDE 0.9% FLUSH: 3.0000 mL | INTRAVENOUS | Status: DC | PRN

## 2016-02-14 MED ORDER — SCOPOLAMINE 1 MG/3DAYS TD PT72: 1.0000 | MEDICATED_PATCH | Freq: Once | TRANSDERMAL | Status: DC

## 2016-02-14 MED ORDER — OXYCODONE-ACETAMINOPHEN 5-325 MG PO TABS: 1.0000 | ORAL_TABLET | ORAL | Status: DC | PRN

## 2016-02-14 MED ORDER — SODIUM CHLORIDE 0.9 % IR SOLN
Status: DC | PRN
  Administered 2016-02-14: 1000 mL

## 2016-02-14 MED ORDER — NALBUPHINE HCL 10 MG/ML IJ SOLN: 5.0000 mg | INTRAMUSCULAR | Status: DC | PRN

## 2016-02-14 MED ORDER — PHENYLEPHRINE 40 MCG/ML (10ML) SYRINGE FOR IV PUSH (FOR BLOOD PRESSURE SUPPORT): 80.0000 ug | PREFILLED_SYRINGE | INTRAVENOUS | Status: DC | PRN

## 2016-02-14 MED ORDER — EPHEDRINE 5 MG/ML INJ: 10.0000 mg | INTRAVENOUS | Status: DC | PRN

## 2016-02-14 MED ORDER — MEPERIDINE HCL 25 MG/ML IJ SOLN: 6.2500 mg | INTRAMUSCULAR | Status: DC | PRN

## 2016-02-14 MED ORDER — OXYTOCIN 40 UNITS IN LACTATED RINGERS INFUSION - SIMPLE MED
1.0000 m[IU]/min | INTRAVENOUS | Status: DC
  Administered 2016-02-14: 2 m[IU]/min via INTRAVENOUS
  Filled 2016-02-14: qty 1000

## 2016-02-14 MED ORDER — DIPHENHYDRAMINE HCL 50 MG/ML IJ SOLN: 12.5000 mg | INTRAMUSCULAR | Status: DC | PRN

## 2016-02-14 MED ORDER — LACTATED RINGERS IV SOLN
40.0000 [IU] | INTRAVENOUS | Status: DC | PRN
  Administered 2016-02-14: 40 [IU] via INTRAVENOUS

## 2016-02-14 MED ORDER — LACTATED RINGERS IV SOLN
INTRAVENOUS | Status: DC
  Administered 2016-02-14 – 2016-02-15 (×2): via INTRAVENOUS

## 2016-02-14 MED ORDER — LACTATED RINGERS IV SOLN: 500.0000 mL | Freq: Once | INTRAVENOUS | Status: DC

## 2016-02-14 MED ORDER — MENTHOL 3 MG MT LOZG: 1.0000 | LOZENGE | OROMUCOSAL | Status: DC | PRN

## 2016-02-14 MED ORDER — MEPERIDINE HCL 25 MG/ML IJ SOLN
INTRAMUSCULAR | Status: AC
  Filled 2016-02-14: qty 1

## 2016-02-14 MED ORDER — MORPHINE SULFATE (PF) 0.5 MG/ML IJ SOLN
INTRAMUSCULAR | Status: DC | PRN
  Administered 2016-02-14: 4 mg via EPIDURAL

## 2016-02-14 MED ORDER — LIDOCAINE-EPINEPHRINE (PF) 2 %-1:200000 IJ SOLN
INTRAMUSCULAR | Status: AC
  Filled 2016-02-14: qty 20

## 2016-02-14 MED ORDER — IBUPROFEN 600 MG PO TABS
600.0000 mg | ORAL_TABLET | Freq: Four times a day (QID) | ORAL | Status: DC
  Administered 2016-02-15 – 2016-02-18 (×12): 600 mg via ORAL
  Filled 2016-02-14 (×13): qty 1

## 2016-02-14 MED ORDER — NALOXONE HCL 0.4 MG/ML IJ SOLN: 0.4000 mg | INTRAMUSCULAR | Status: DC | PRN

## 2016-02-14 MED ORDER — DIPHENHYDRAMINE HCL 25 MG PO CAPS: 25.0000 mg | ORAL_CAPSULE | ORAL | Status: DC | PRN

## 2016-02-14 MED ORDER — CITRIC ACID-SODIUM CITRATE 334-500 MG/5ML PO SOLN
30.0000 mL | ORAL | Status: DC | PRN
  Administered 2016-02-14: 30 mL via ORAL
  Filled 2016-02-14: qty 15

## 2016-02-14 MED ORDER — DIPHENHYDRAMINE HCL 50 MG/ML IJ SOLN
INTRAMUSCULAR | Status: AC
  Filled 2016-02-14: qty 1

## 2016-02-14 MED ORDER — OXYCODONE-ACETAMINOPHEN 5-325 MG PO TABS: 2.0000 | ORAL_TABLET | ORAL | Status: DC | PRN

## 2016-02-14 MED ORDER — OXYTOCIN 10 UNIT/ML IJ SOLN
INTRAMUSCULAR | Status: AC
  Filled 2016-02-14: qty 4

## 2016-02-14 MED ORDER — ONDANSETRON HCL 4 MG/2ML IJ SOLN
INTRAMUSCULAR | Status: DC | PRN
  Administered 2016-02-14: 4 mg via INTRAVENOUS

## 2016-02-14 MED ORDER — PRENATAL MULTIVITAMIN CH
1.0000 | ORAL_TABLET | Freq: Every day | ORAL | Status: DC
  Administered 2016-02-15 – 2016-02-18 (×4): 1 via ORAL
  Filled 2016-02-14 (×4): qty 1

## 2016-02-14 MED ORDER — LACTATED RINGERS IV SOLN
500.0000 mL | Freq: Once | INTRAVENOUS | Status: AC
  Administered 2016-02-14: 500 mL via INTRAVENOUS

## 2016-02-14 MED ORDER — MEPERIDINE HCL 25 MG/ML IJ SOLN
INTRAMUSCULAR | Status: DC | PRN
  Administered 2016-02-14 (×2): 12.5 mg via INTRAVENOUS

## 2016-02-14 MED ORDER — KETOROLAC TROMETHAMINE 30 MG/ML IJ SOLN
30.0000 mg | Freq: Four times a day (QID) | INTRAMUSCULAR | Status: AC | PRN
  Administered 2016-02-14: 30 mg via INTRAMUSCULAR

## 2016-02-14 MED ORDER — LABETALOL HCL 200 MG PO TABS
200.0000 mg | ORAL_TABLET | Freq: Two times a day (BID) | ORAL | Status: DC
  Administered 2016-02-14 – 2016-02-18 (×6): 200 mg via ORAL
  Filled 2016-02-14 (×8): qty 1

## 2016-02-14 MED ORDER — SIMETHICONE 80 MG PO CHEW
80.0000 mg | CHEWABLE_TABLET | Freq: Three times a day (TID) | ORAL | Status: DC
  Administered 2016-02-14 – 2016-02-17 (×8): 80 mg via ORAL
  Filled 2016-02-14 (×10): qty 1

## 2016-02-14 MED ORDER — OXYCODONE HCL 5 MG PO TABS
5.0000 mg | ORAL_TABLET | ORAL | Status: DC | PRN
Start: 2016-02-14 — End: 2016-02-18
  Administered 2016-02-15 (×2): 5 mg via ORAL
  Filled 2016-02-14 (×3): qty 1

## 2016-02-14 MED ORDER — COCONUT OIL OIL: 1.0000 "application " | TOPICAL_OIL | Status: DC | PRN

## 2016-02-14 MED ORDER — MISOPROSTOL 25 MCG QUARTER TABLET
25.0000 ug | ORAL_TABLET | ORAL | Status: DC | PRN
  Administered 2016-02-14 (×2): 25 ug via VAGINAL
  Filled 2016-02-14 (×2): qty 0.25

## 2016-02-14 MED ORDER — NALBUPHINE HCL 10 MG/ML IJ SOLN: 5.0000 mg | Freq: Once | INTRAMUSCULAR | Status: DC | PRN

## 2016-02-14 MED ORDER — SIMETHICONE 80 MG PO CHEW: 80.0000 mg | CHEWABLE_TABLET | ORAL | Status: DC | PRN

## 2016-02-14 MED ORDER — WITCH HAZEL-GLYCERIN EX PADS: 1.0000 "application " | MEDICATED_PAD | CUTANEOUS | Status: DC | PRN

## 2016-02-14 MED ORDER — DEXAMETHASONE SODIUM PHOSPHATE 4 MG/ML IJ SOLN
INTRAMUSCULAR | Status: DC | PRN
  Administered 2016-02-14: 4 mg via INTRAVENOUS

## 2016-02-14 MED ORDER — SENNOSIDES-DOCUSATE SODIUM 8.6-50 MG PO TABS
2.0000 | ORAL_TABLET | ORAL | Status: DC
  Administered 2016-02-14 – 2016-02-17 (×4): 2 via ORAL
  Filled 2016-02-14 (×4): qty 2

## 2016-02-14 MED ORDER — LIDOCAINE HCL (PF) 1 % IJ SOLN
INTRAMUSCULAR | Status: DC | PRN
  Administered 2016-02-14: 5 mL
  Administered 2016-02-14: 2 mL via EPIDURAL
  Administered 2016-02-14: 3 mL

## 2016-02-14 MED ORDER — ONDANSETRON HCL 4 MG/2ML IJ SOLN
INTRAMUSCULAR | Status: AC
  Filled 2016-02-14: qty 2

## 2016-02-14 MED ORDER — TETANUS-DIPHTH-ACELL PERTUSSIS 5-2.5-18.5 LF-MCG/0.5 IM SUSP: 0.5000 mL | Freq: Once | INTRAMUSCULAR | Status: DC

## 2016-02-14 MED ORDER — OXYTOCIN BOLUS FROM INFUSION: 500.0000 mL | INTRAVENOUS | Status: DC

## 2016-02-14 MED ORDER — DIPHENHYDRAMINE HCL 25 MG PO CAPS: 25.0000 mg | ORAL_CAPSULE | Freq: Four times a day (QID) | ORAL | Status: DC | PRN

## 2016-02-14 MED ORDER — CEFAZOLIN SODIUM-DEXTROSE 2-3 GM-% IV SOLR
INTRAVENOUS | Status: DC | PRN
Start: 2016-02-14 — End: 2016-02-14
  Administered 2016-02-14: 2 g via INTRAVENOUS

## 2016-02-14 MED ORDER — ONDANSETRON HCL 4 MG/2ML IJ SOLN: 4.0000 mg | Freq: Four times a day (QID) | INTRAMUSCULAR | Status: DC | PRN

## 2016-02-14 MED ORDER — SCOPOLAMINE 1 MG/3DAYS TD PT72
MEDICATED_PATCH | TRANSDERMAL | Status: AC
  Filled 2016-02-14: qty 1

## 2016-02-14 MED ORDER — LACTATED RINGERS IV SOLN
INTRAVENOUS | Status: DC
  Administered 2016-02-14 (×3): via INTRAVENOUS

## 2016-02-14 MED ORDER — SODIUM BICARBONATE 8.4 % IV SOLN
INTRAVENOUS | Status: AC
  Filled 2016-02-14: qty 50

## 2016-02-14 MED ORDER — LABETALOL HCL 200 MG PO TABS
200.0000 mg | ORAL_TABLET | Freq: Two times a day (BID) | ORAL | Status: DC
  Administered 2016-02-14: 200 mg via ORAL
  Filled 2016-02-14 (×3): qty 1

## 2016-02-14 MED ORDER — DEXTROSE 5 % IV SOLN
2.5000 10*6.[IU] | INTRAVENOUS | Status: DC
  Administered 2016-02-14: 2.5 10*6.[IU] via INTRAVENOUS
  Filled 2016-02-14 (×6): qty 2.5

## 2016-02-14 MED ORDER — ACETAMINOPHEN 325 MG PO TABS
650.0000 mg | ORAL_TABLET | ORAL | Status: DC | PRN
  Administered 2016-02-14 – 2016-02-17 (×2): 650 mg via ORAL
  Filled 2016-02-14 (×2): qty 2

## 2016-02-14 MED ORDER — TERBUTALINE SULFATE 1 MG/ML IJ SOLN: 0.2500 mg | Freq: Once | INTRAMUSCULAR | Status: DC | PRN

## 2016-02-14 MED ORDER — FENTANYL 2.5 MCG/ML BUPIVACAINE 1/10 % EPIDURAL INFUSION (WH - ANES)
14.0000 mL/h | INTRAMUSCULAR | Status: DC | PRN
  Administered 2016-02-14: 14 mL/h via EPIDURAL
  Filled 2016-02-14: qty 125

## 2016-02-14 MED ORDER — ZOLPIDEM TARTRATE 5 MG PO TABS: 5.0000 mg | ORAL_TABLET | Freq: Every evening | ORAL | Status: DC | PRN

## 2016-02-14 SURGICAL SUPPLY — 36 items
ADH SKN CLS LQ APL DERMABOND (GAUZE/BANDAGES/DRESSINGS) ×1
BARRIER ADHS 3X4 INTERCEED (GAUZE/BANDAGES/DRESSINGS) IMPLANT
BRR ADH 4X3 ABS CNTRL BYND (GAUZE/BANDAGES/DRESSINGS)
CLAMP CORD UMBIL (MISCELLANEOUS) IMPLANT
CLOTH BEACON ORANGE TIMEOUT ST (SAFETY) ×3 IMPLANT
CONTAINER PREFILL 10% NBF 15ML (MISCELLANEOUS) IMPLANT
DERMABOND ADHESIVE PROPEN (GAUZE/BANDAGES/DRESSINGS) ×2
DERMABOND ADVANCED .7 DNX6 (GAUZE/BANDAGES/DRESSINGS) IMPLANT
DRSG OPSITE POSTOP 4X10 (GAUZE/BANDAGES/DRESSINGS) ×3 IMPLANT
DURAPREP 26ML APPLICATOR (WOUND CARE) ×3 IMPLANT
ELECT REM PT RETURN 9FT ADLT (ELECTROSURGICAL) ×3
ELECTRODE REM PT RTRN 9FT ADLT (ELECTROSURGICAL) ×1 IMPLANT
EXTRACTOR VACUUM M CUP 4 TUBE (SUCTIONS) IMPLANT
EXTRACTOR VACUUM M CUP 4' TUBE (SUCTIONS)
GLOVE BIO SURGEON STRL SZ 6.5 (GLOVE) ×2 IMPLANT
GLOVE BIO SURGEONS STRL SZ 6.5 (GLOVE) ×1
GLOVE BIOGEL PI IND STRL 7.0 (GLOVE) ×1 IMPLANT
GLOVE BIOGEL PI INDICATOR 7.0 (GLOVE) ×2
GOWN STRL REUS W/TWL LRG LVL3 (GOWN DISPOSABLE) ×6 IMPLANT
KIT ABG SYR 3ML LUER SLIP (SYRINGE) IMPLANT
NDL HYPO 25X5/8 SAFETYGLIDE (NEEDLE) ×1 IMPLANT
NEEDLE HYPO 22GX1.5 SAFETY (NEEDLE) IMPLANT
NEEDLE HYPO 25X5/8 SAFETYGLIDE (NEEDLE) ×3 IMPLANT
NS IRRIG 1000ML POUR BTL (IV SOLUTION) ×3 IMPLANT
PACK C SECTION WH (CUSTOM PROCEDURE TRAY) ×3 IMPLANT
PAD OB MATERNITY 4.3X12.25 (PERSONAL CARE ITEMS) ×3 IMPLANT
PENCIL SMOKE EVAC W/HOLSTER (ELECTROSURGICAL) ×3 IMPLANT
SUT CHROMIC 0 CTX 36 (SUTURE) ×6 IMPLANT
SUT PLAIN 0 NONE (SUTURE) IMPLANT
SUT PLAIN 2 0 XLH (SUTURE) IMPLANT
SUT VIC AB 0 CT1 27 (SUTURE) ×9
SUT VIC AB 0 CT1 27XBRD ANBCTR (SUTURE) ×3 IMPLANT
SUT VIC AB 4-0 KS 27 (SUTURE) IMPLANT
SYR CONTROL 10ML LL (SYRINGE) IMPLANT
TOWEL OR 17X24 6PK STRL BLUE (TOWEL DISPOSABLE) ×3 IMPLANT
TRAY FOLEY CATH SILVER 14FR (SET/KITS/TRAYS/PACK) ×3 IMPLANT

## 2016-02-14 NOTE — Lactation Note (Signed)
This note was copied from a baby's chart. Lactation Consultation Note Initial visit at 5 hours of age.  Mom reports a few good feedings.  RN requested consult due to mom having a right inverted nipple.  MOm is reclined in bed with baby STS, as she is recovering from a c/s.  Mom reports recent attempt with latch but baby was sleepy.  Mom has short everted nipples and with compression right nipple tucks in.  Mom reports having hand pump, but has not used it yet and denies problems with baby latching so far.  [facility] resources given and discussed.  Encouraged to feed with early cues on demand.  Early newborn behavior discussed.  Hand expression demonstrated by mom with colostrum visible.  Mom to call for assist as needed.    Patient Name: Mary Buchanan [id number]'[id number]'s Date: 02/14/2016 Reason for consult: Initial assessment   Maternal Data Has patient been taught Hand Expression?: Yes Does the patient have breastfeeding experience prior to this delivery?: No  Feeding Feeding Type: Breast Fed Length of feed: 10 min  LATCH Score/Interventions Latch: Repeated attempts needed to sustain latch, nipple held in mouth throughout feeding, stimulation needed to elicit sucking reflex. Intervention(s): Adjust position;Assist with latch;Breast massage  Audible Swallowing: None Intervention(s): Hand expression;Skin to skin Intervention(s): Hand expression;Skin to skin  Type of Nipple: Everted at rest and after stimulation (R breast is inverted, L goes in when compressed)  Comfort (Breast/Nipple): Soft / non-tender     Hold (Positioning): Assistance needed to correctly position infant at breast and maintain latch. Intervention(s): Breastfeeding basics reviewed;Support Pillows;Position options  LATCH Score: 6  Lactation Tools Discussed/Used     Consult Status Consult Status: Follow-up Date: 02/15/16 Follow-up type: In-patient    Jannifer RodneyShoptaw, Rohail Klees Lynn 02/14/2016, 11:05 PM

## 2016-02-14 NOTE — Consult Note (Signed)
Neonatology Note:   Attendance at C-section:    I was asked by Dr. Vincente PoliGrewal to attend this C/S at term for failure to progress. The mother is a G1, GBS positive with good prenatal care and aIAP. ROM 10 hours before delivery, fluid clear. Infant vigorous with good spontaneous cry and tone. Needed only minimal bulb suctioning. Ap 8/9. Lungs clear to ausc in DR. To CN to care of Pediatrician.  Dineen Kidavid C. Leary RocaEhrmann, MD

## 2016-02-14 NOTE — H&P (Signed)
Mary Buchanan is a 40 y.o. G 1 P 0 at 40 w 1 day presents for induction. History of chronic Hypertension on Labetalol. Patient status post cytotec x 2. History OB History    Gravida Para Term Preterm AB TAB SAB Ectopic Multiple Living   1              Past Medical History  Diagnosis Date  . Vaginal Pap smear, abnormal   . Hx of varicella   . HTN in pregnancy, chronic    Past Surgical History  Procedure Laterality Date  . Knee surgery  AGE 40    BENIGN TUMOR REMOVED RIGHT KNEE  . Thyroid cyst bx    . Intrauterine device (iud) insertion  01/11/2014    Mirena  . Gynecologic cryosurgery  2003    mild dysplasia   Family History: family history includes Breast cancer in her maternal grandmother; Cancer in her mother; Heart attack (age of onset: 6460) in her mother; Hypertension in her maternal grandmother and mother. Social History:  reports that she has quit smoking. Her smoking use included Cigarettes. She smoked 0.25 packs per day. She has never used smokeless tobacco. She reports that she drinks alcohol. She reports that she does not use illicit drugs.   Prenatal Transfer Tool  Maternal Diabetes: No Genetic Screening: Normal Maternal Ultrasounds/Referrals: Normal Fetal Ultrasounds or other Referrals:  None Maternal Substance Abuse:  No Significant Maternal Medications:  None Significant Maternal Lab Results:  None Other Comments:  None  Review of Systems  All other systems reviewed and are negative.   Dilation: 1.5 Effacement (%): 70 Station: -2 Exam by:: Vincente PoliGrewal, MD Blood pressure 113/63, pulse 82, temperature 98.1 F (36.7 C), temperature source Oral, resp. rate 17, height 5\' 4"  (1.626 m), weight 79.379 kg (175 lb), last menstrual period 05/09/2015. Maternal Exam:  Abdomen: Fetal presentation: vertex     Fetal Exam Fetal State Assessment: Category I - tracings are normal.     Physical Exam  Nursing note and vitals reviewed. Constitutional: She appears  well-developed and well-nourished.  HENT:  Head: Normocephalic.  Eyes: Pupils are equal, round, and reactive to light.  Neck: Normal range of motion.  Cardiovascular: Normal rate and regular rhythm.   Respiratory: Effort normal.    Prenatal labs: ABO, Rh: --/--/O POS, O POS (05/16 0115) Antibody: NEG (05/16 0115) Rubella: Immune (10/05 0000) RPR: Nonreactive (10/05 0000)  HBsAg: Negative (10/05 0000)  HIV: Non-reactive (10/05 0000)  GBS: Positive (05/11 0000)   Assessment/Plan: IUP at 40 w 1 day Chronic Hypertension Induction of labor Pitocin per protocol Epidural   Mary Buchanan 02/14/2016, 7:47 AM

## 2016-02-14 NOTE — Anesthesia Postprocedure Evaluation (Signed)
Anesthesia Post Note  Patient: Mary Buchanan  Procedure(s) Performed: Procedure(s) (LRB): CESAREAN SECTION (N/A)  Patient location during evaluation: Mother Baby Anesthesia Type: Epidural Level of consciousness: awake and alert Pain management: pain level controlled Vital Signs Assessment: post-procedure vital signs reviewed and stable Respiratory status: spontaneous breathing, nonlabored ventilation and respiratory function stable Cardiovascular status: stable Postop Assessment: no headache, no backache and epidural receding Anesthetic complications: no     Last Vitals:  Filed Vitals:   02/14/16 1829 02/14/16 1900  BP:  129/77  Pulse: 78   Temp:  36.3 C  Resp: 14     Last Pain:  Filed Vitals:   02/14/16 1909  PainSc: 4    Pain Goal: Patients Stated Pain Goal: 6 (02/14/16 1100)               Reino KentJudd, Soriyah Osberg J

## 2016-02-14 NOTE — Progress Notes (Signed)
Patient now comfortable with epidural  FHR Category 1  Cervix is 80% very tight 2 cm -2 Scar tissue felt high up - patient has history of LEEP [place]one Biopsy of Cervix  Unable to place IUPC x 3 because it curls up - possibly secondary to scar tissue  Discussed with patient that I feel scar tissue is present  Recommend  Continuous monitoring Recheck later this afternoon if no progression discuss C Section

## 2016-02-14 NOTE — Transfer of Care (Signed)
Immediate Anesthesia Transfer of Care Note  Patient: Mary Buchanan  Procedure(s) Performed: Procedure(s): CESAREAN SECTION (N/A)  Patient Location: PACU  Anesthesia Type:Epidural  Level of Consciousness: awake, alert , oriented and patient cooperative  Airway & Oxygen Therapy: Patient Spontanous Breathing  Post-op Assessment: Report given to RN and Post -op Vital signs reviewed and stable  Post vital signs: Reviewed and stable  Last Vitals:  Filed Vitals:   02/14/16 1601 02/14/16 1630  BP: 133/81 131/81  Pulse: 80 80  Temp:    Resp:      Last Pain:  Filed Vitals:   02/14/16 1717  PainSc: 0-No pain      Patients Stated Pain Goal: 6 (02/14/16 1100)  Complications: No apparent anesthesia complications

## 2016-02-14 NOTE — Progress Notes (Signed)
Patient doing well.  Toco contractions every 2 minutes good pattern FHR Category 1  Cervix no change Scar tissue still present  Discussed with patient - inability to place IUPC secondary to scar tissue   Patient wants C Section  Risks reviewed  Consent signed

## 2016-02-14 NOTE — Brief Op Note (Signed)
02/14/2016  5:29 PM  PATIENT:  Mary Buchanan  40 y.o. female  PRE-OPERATIVE DIAGNOSIS: IUP at 40 w 1 day Failure to Progress  POST-OPERATIVE DIAGNOSIS:   Same  PROCEDURE:  Procedure(s): CESAREAN SECTION (N/A)  SURGEON:  Surgeon(s) and Role:    * Marcelle OverlieMichelle Antwon Rochin, MD - Primary  PHYSICIAN ASSISTANT:   ASSISTANTS: none   ANESTHESIA:   epidural  EBL:  Total I/O In: 500 [I.V.:500] Out: 950 [Urine:150; Blood:800]  BLOOD ADMINISTERED:none  DRAINS: Urinary Catheter (Foley)   LOCAL MEDICATIONS USED:  NONE  SPECIMEN:  No Specimen  DISPOSITION OF SPECIMEN:  N/A  COUNTS:  YES  TOURNIQUET:  * No tourniquets in log *  DICTATION: .Other Dictation: Dictation Number [id number]  PLAN OF CARE: Admit to inpatient   PATIENT DISPOSITION:  PACU - hemodynamically stable.   Delay start of Pharmacological VTE agent (>24hrs) due to surgical blood loss or risk of bleeding: not applicable

## 2016-02-14 NOTE — Anesthesia Procedure Notes (Signed)
Epidural Patient location during procedure: OB  Staffing Anesthesiologist: Marcene DuosFITZGERALD, Ravinder Lukehart Performed by: anesthesiologist   Preanesthetic Checklist Completed: patient identified, site marked, surgical consent, pre-op evaluation, timeout performed, IV checked, risks and benefits discussed and monitors and equipment checked  Epidural Patient position: sitting Prep: site prepped and draped and DuraPrep Patient monitoring: continuous pulse ox and blood pressure Approach: midline Location: L4-L5 Injection technique: LOR saline  Needle:  Needle type: Tuohy  Needle gauge: 17 G Needle length: 9 cm and 9 Needle insertion depth: 6 cm Catheter type: closed end flexible Catheter size: 19 Gauge Catheter at skin depth: 12 (11cm initially. Advanced to 12cm when laid in right lat decub) cm Test dose: negative  Assessment Sensory level: T10 Events: blood not aspirated, injection not painful, no injection resistance, negative IV test and no paresthesia

## 2016-02-14 NOTE — Anesthesia Preprocedure Evaluation (Signed)
Anesthesia Evaluation  Patient identified by MRN, date of birth, ID band Patient awake    Reviewed: Allergy & Precautions, Patient's Chart, lab work & pertinent test results  Airway Mallampati: II  TM Distance: >3 FB Neck ROM: Full    Dental   Pulmonary former smoker,    Pulmonary exam normal        Cardiovascular hypertension (cHTN), Pt. on medications Normal cardiovascular exam     Neuro/Psych negative neurological ROS     GI/Hepatic negative GI ROS, Neg liver ROS,   Endo/Other  negative endocrine ROS  Renal/GU negative Renal ROS     Musculoskeletal   Abdominal   Peds  Hematology negative hematology ROS (+)   Anesthesia Other Findings   Reproductive/Obstetrics (+) Pregnancy (IOL for cHTN)                             Lab Results  Component Value Date   WBC 13.3* 02/14/2016   HGB 12.0 02/14/2016   HCT 33.9* 02/14/2016   MCV 91.4 02/14/2016   PLT 224 02/14/2016   Lab Results  Component Value Date   CREATININE 0.59 10/11/2014   BUN 8 10/11/2014   NA 138 10/11/2014   K 4.0 10/11/2014   CL 106 10/11/2014   CO2 24 10/11/2014    Anesthesia Physical Anesthesia Plan  ASA: II  Anesthesia Plan: Epidural   Post-op Pain Management:    Induction:   Airway Management Planned: Natural Airway  Additional Equipment:   Intra-op Plan:   Post-operative Plan:   Informed Consent: I have reviewed the patients History and Physical, chart, labs and discussed the procedure including the risks, benefits and alternatives for the proposed anesthesia with the patient or authorized representative who has indicated his/her understanding and acceptance.     Plan Discussed with:   Anesthesia Plan Comments:         Anesthesia Quick Evaluation

## 2016-02-14 NOTE — Anesthesia Pain Management Evaluation Note (Signed)
  CRNA Pain Management Visit Note  Patient: Mary Buchanan, 40 y.o., female  "Hello I am a member of the anesthesia team at [facility]. We have an anesthesia team available at all times to provide care throughout the hospital, including epidural management and anesthesia for C-section. I don't know your plan for the delivery whether it a natural birth, water birth, IV sedation, nitrous supplementation, doula or epidural, but we want to meet your pain goals."   1.Was your pain managed to your expectations on prior hospitalizations?   No prior hospitalizations  2.What is your expectation for pain management during this hospitalization?     Epidural  3.How can we help you reach that goal? epidural  Record the patient's initial score and the patient's pain goal.   Pain: 0  Pain Goal: 6 [facility] wants you to be able to say your pain was always managed very well.  Cephus ShellingBURGER,Mary Deschler 02/14/2016

## 2016-02-15 LAB — RUBELLA SCREEN: Rubella: 2.5 index (ref >0.99)

## 2016-02-15 LAB — CBC
HCT: 27.6 % — ABNORMAL LOW (ref 36.0–46.0)
HEMOGLOBIN: 9.4 g/dL — AB (ref 12.0–15.0)
MCH: 31.9 pg (ref 26.0–34.0)
MCHC: 34.1 g/dL (ref 30.0–36.0)
MCV: 93.6 fL (ref 78.0–100.0)
PLATELETS: 228 10*3/uL (ref 150–400)
RBC: 2.95 MIL/uL — ABNORMAL LOW (ref 3.87–5.11)
RDW: 14.2 % (ref 11.5–15.5)
WBC: 18.6 10*3/uL — ABNORMAL HIGH (ref 4.0–10.5)

## 2016-02-15 NOTE — Anesthesia Postprocedure Evaluation (Signed)
Anesthesia Post Note  Patient: Mary Buchanan  Procedure(s) Performed: Procedure(s) (LRB): CESAREAN SECTION (N/A)  Patient location during evaluation: Mother Baby Anesthesia Type: Epidural Level of consciousness: awake, awake and alert, oriented and patient cooperative Pain management: pain level controlled Vital Signs Assessment: post-procedure vital signs reviewed and stable Respiratory status: spontaneous breathing, nonlabored ventilation and respiratory function stable Cardiovascular status: stable Postop Assessment: no headache, no backache, patient able to bend at knees and no signs of nausea or vomiting Anesthetic complications: no     Last Vitals:  Filed Vitals:   02/15/16 0300 02/15/16 0655  BP: 115/57 103/52  Pulse: 79 80  Temp: 36.6 C 36.6 C  Resp: 18 18    Last Pain:  Filed Vitals:   02/15/16 0918  PainSc: 1    Pain Goal: Patients Stated Pain Goal: 6 (02/14/16 1100)               Linde Wilensky L

## 2016-02-15 NOTE — Op Note (Signed)
NAMJulaine Hua:  Buchanan, Mary Buchanan               ACCOUNT NO.:  [id number]  MEDICAL RECORD NO.:  [id number]  LOCATION:  9145                          FACILITY:  [facility]  PHYSICIAN:  Aidel Davisson L. Khloe Hunkele, M.D.DATE OF BIRTH:  09-Oct-1975  DATE OF PROCEDURE:  02/14/2016 DATE OF DISCHARGE:                              OPERATIVE REPORT   PREOPERATIVE DIAGNOSIS:  Intrauterine pregnancy at 40 weeks and 1 day and failure to progress.  POSTOPERATIVE DIAGNOSIS:  Intrauterine pregnancy at 40 weeks and 1 day and failure to progress.  PROCEDURE:  Primary low transverse cesarean section.  SURGEON:  Loyalty Brashier L. Jazline Cumbee, MD  ANESTHESIA:  Epidural.  EBL:  Less than 500.  COMPLICATIONS:  None.  DRAINS:  Foley.  PROCEDURE IN DETAIL:  Patient was taken to the operating room.  Her epidural was dosed.  She was found to be adequate.  She was prepped and draped in usual sterile fashion.  A low transverse incision was made, carried down to the fascia.  Fascia was scored in the midline, extended laterally.  Rectus muscles separated in the midline.  The peritoneum was entered bluntly.  The peritoneal incision was then stretched.  The lower uterine segment was identified.  The bladder flap was created sharply and then digitally.  The bladder flap was then readjusted.  A low transverse incision was made in the uterus.  Uterus was entered using a hemostat.  The baby was delivered easily, was in cephalic presentation with a female infant.  Cord was clamped and cut and the Apgars were 8 and 9.  The baby was handed to the awaiting neonatal team.  The placenta was manually removed, noted to be normal intact with a 3-vessel cord. Uterus was exteriorized and the placenta was delivered easily and the uterus was cleared of all clots and debris.  The uterine incision was closed in 1 layer using 0 chromic in a running, locked stitch.  Uterus was returned to the abdomen.  The peritoneum was closed using 0 Vicryl each corner meeting in  the midline.  After irrigation, the skin was closed with a 3-0 Vicryl on a Keith needle.  All sponge, lap, and instrument counts were correct x2.  The patient went to recovery room in stable condition.    Basil Buffin L. Vincente PoliGrewal, M.D.    Florestine AversMLG/MEDQ  D:  02/14/2016  T:  02/15/2016  Job:  [id number]

## 2016-02-15 NOTE — Progress Notes (Signed)
Day 0  No c/o  abd soft + BS  Dressing dry  CBC    Component Value Date/Time   WBC 18.6* 02/15/2016 0551   RBC 2.95* 02/15/2016 0551   HGB 9.4* 02/15/2016 0551   HCT 27.6* 02/15/2016 0551   PLT 228 02/15/2016 0551   MCV 93.6 02/15/2016 0551   MCH 31.9 02/15/2016 0551   MCHC 34.1 02/15/2016 0551   RDW 14.2 02/15/2016 0551   LYMPHSABS 1.7 10/11/2014 1238   MONOABS 0.5 10/11/2014 1238   EOSABS 0.1 10/11/2014 1238   BASOSABS 0.0 10/11/2014 1238    Up to ambulate Poss D/C in AM

## 2016-02-15 NOTE — Addendum Note (Signed)
Addendum  created 02/15/16 0920 by Yolonda KidaAlison L Toddy Boyd, CRNA   Modules edited: Clinical Notes   Clinical Notes:  File: [id number]

## 2016-02-15 NOTE — Lactation Note (Signed)
This note was copied from a baby's chart. Lactation Consultation Note Follow up visit at 20 hours of age.  RN gave mom NS overnight, but mom reports she doesn't want to have to depend on it.  Baby had recent feeding on left breast that everts more than right.  Mom reports right nipple is inverted, nipple is noted to be everted with short shaft and tucks in with compression as does the left where mom reports good latching.  Encouraged mom to pre pump for next feeding with alternating positions.  Mom to call for assist with next [facility] for [facility] or RN to assist.  Mom is able to demonstrate NS use on right nipple and encouraged to use if needed, but mom will continue to try without.  Baby asleep on mom STS now.  Mom reports offering some spoon feedings and discussed possible need to use DEBP if baby doesn't improve latch on right breast.  LC encouraged mom to use hand pump at bedside for stimulation as well.  FOB at bedside supportive.    Patient Name: Mary Buchanan'UToday's Date: 02/15/2016 Reason for consult: Follow-up assessment;Difficult latch   Maternal Data    Feeding Feeding Type: Breast Fed Length of feed: 10 min  LATCH Score/Interventions Latch: Repeated attempts needed to sustain latch, nipple held in mouth throughout feeding, stimulation needed to elicit sucking reflex. Intervention(s): Assist with latch  Audible Swallowing: Spontaneous and intermittent  Type of Nipple: Everted at rest and after stimulation  Comfort (Breast/Nipple): Soft / non-tender     Hold (Positioning): Assistance needed to correctly position infant at breast and maintain latch. Intervention(s): Breastfeeding basics reviewed  LATCH Score: 8  Lactation Tools Discussed/Used     Consult Status Consult Status: Follow-up Date: 02/15/16 Follow-up type: In-patient    Shoptaw, Arvella MerlesJana Lynn 02/15/2016, 1:40 PM

## 2016-02-16 LAB — BIRTH TISSUE RECOVERY COLLECTION (PLACENTA DONATION)

## 2016-02-16 NOTE — Progress Notes (Signed)
CSW acknowledges NICU admission.    Patient screened out for psychosocial assessment since none of the following apply:  Psychosocial stressors documented in mother or baby's chart  Gestation less than 32 weeks  Code at delivery   Infant with anomalies  Please contact the Clinical Social Worker if specific needs arise, or by MOB's request.       

## 2016-02-16 NOTE — Progress Notes (Signed)
Subjective: Postpartum Day 1: Cesarean Delivery Patient reports tolerating PO, + flatus, + BM and no problems voiding.  Baby in NICU due to low glucose Objective: Vital signs in last 24 hours: Temp:  [97.6 F (36.4 C)-97.7 F (36.5 C)] 97.6 F (36.4 C) (05/18 0515) Pulse Rate:  [75-83] 79 (05/18 0912) Resp:  [18] 18 (05/18 0515) BP: (105-133)/(54-80) 133/80 mmHg (05/18 0912) SpO2:  [98 %] 98 % (05/18 0515)  Physical Exam:  General: alert, cooperative, appears stated age and mild distress Lochia: appropriate Uterine Fundus: firm Incision: healing well DVT Evaluation: No evidence of DVT seen on physical exam.   Recent Labs  02/14/16 0115 02/15/16 0551  HGB 12.0 9.4*  HCT 33.9* 27.6*    Assessment/Plan: Status post Cesarean section. Doing well postoperatively.  Continue current care.  Daichi Moris C 02/16/2016, 9:55 AM

## 2016-02-16 NOTE — Lactation Note (Signed)
This note was copied from a baby's chart. Lactation Consultation Note Baby hasn't wanting to BF or suck on nipple. Has asymmetrical head and face. On DPT. sleepy. No suck swallow coordination. Mom has DEBP, pumping, breast are filling, knots noted in breast. Demonstrated breast massage, hand expressed 15ml.. Mom has short shaft nipples. Blood sugar low, mom has been finger feeding w/gloved finger. Baby hasn't had much interested and needed much stimulation. Being transferred to NICU d/t low blood sugars. Mom + for GBS and treated. A lot of stimulating teaching for feeding. Teaching done w/mom about breast care, supply and demand, milk storage.  Patient Name: Boy Julaine Huamanda Bunkley WUJWJ'XToday's Date: 02/16/2016 Reason for consult: Follow-up assessment   Maternal Data    Feeding Feeding Type: Breast Milk Nipple Type: Slow - flow  LATCH Score/Interventions    Intervention(s): Hand expression  Type of Nipple: Everted at rest and after stimulation  Comfort (Breast/Nipple): Filling, red/small blisters or bruises, mild/mod discomfort  Problem noted: Filling Interventions (Filling): Double electric pump;Massage  Intervention(s): Breastfeeding basics reviewed;Support Pillows;Position options;Skin to skin     Lactation Tools Discussed/Used Tools: Pump;Bottle Breast pump type: Double-Electric Breast Pump Pump Review: Setup, frequency, and cleaning;Milk Storage Initiated by:: RN   Consult Status Consult Status: Follow-up Date: 02/16/16 Follow-up type: In-patient    Charyl DancerCARVER, Dustine Stickler G 02/16/2016, 7:33 AM

## 2016-02-17 NOTE — Lactation Note (Signed)
This note was copied from a baby's chart. Lactation Consultation Note  Patient Name: Mary Buchanan ZOXWR'UToday's Date: 02/17/2016 Reason for consult: Follow-up assessment;Difficult latch   Follow up with mom for feeding. Infant was awake and cueing to feed. Mom placed # 24 NS without assistance. 5 fr feeding tube was placed to outside of left breast. Infant was latched to left breast, infant noted to be biting and clamping jaws. He fell asleep easily and never got into a rhythmic sucking pattern, he took 5 cc via 5 fr feeding tube. After 15 minutes he was removed from breast and mom was to finish feeding via bottle. Infant was taking bottle well.  Dad is very anxious and tells me he wishes mom would pump and bottle feed, discussed with family to follow infant's feeding cues, voids, stools and weights to determine if infant is taking enough volume. Mom is able to detect swallows, enc her to pay attention if breast soften with feeding.   Infant with f/u Ped appt Monday Morning. Mom agreeable to OP follow up next week also.   Plan: Breast feed infant 8-12 x in 24 hours at first feeding cues using # 24 NS and 5 fr feeding tube with EBM in syringe Prepump as needed to soften areola prior to feeding Supplement infant post BF with EBM via bottle Pump post feed for 15 minutes on Maintenance setting Call for assistance as needed. Follow up tomorrow. .     Maternal Data    Feeding Feeding Type: Breast Fed  LATCH Score/Interventions Latch: Repeated attempts needed to sustain latch, nipple held in mouth throughout feeding, stimulation needed to elicit sucking reflex. Intervention(s): Adjust position;Assist with latch;Breast massage;Breast compression  Audible Swallowing: A few with stimulation (with SNS) Intervention(s): Skin to skin;Hand expression  Type of Nipple: Everted at rest and after stimulation  Comfort (Breast/Nipple): Soft / non-tender     Hold (Positioning): Assistance needed  to correctly position infant at breast and maintain latch. Intervention(s): Breastfeeding basics reviewed;Support Pillows;Position options;Skin to skin  LATCH Score: 7  Lactation Tools Discussed/Used Tools: Nipple Shields;34F feeding tube / Syringe Nipple shield size: 24   Consult Status Consult Status: Follow-up Date: 02/18/16 Follow-up type: In-patient    Mary Buchanan 02/17/2016, 6:12 PM

## 2016-02-17 NOTE — Lactation Note (Signed)
This note was copied from a baby's chart. Lactation Consultation Note  Patient Name: Mary Buchanan RUEAV'WToday's Date: 02/17/2016 Reason for consult: Follow-up assessment;NICU baby   Follow up with mom in NICU. Infant was awake and ready to feed. Mom pre pumped prior to coming to NICU for 5 minutes as she is feeling full. Assisted mom in latching infant to right breast in football hold. Mom's breasts are firm and nipples/areola are firm and non stretchable. Mom with short shafted everted nipples. Infant attempted to latch several times without success. # 20 NS applied and infant did have intermittent suckling noted, he needed stimulation to maintain suckling. Then placed #24 NS and infant latched, he was noted to sustain latch and suckling better with # 24 NS. EBM was noted in NS after feeding. Infant then fed 30 cc EBM via bottle.   Attended rounds for infant. [facility] Parents are pleased with plan. Discussed with mom using SNS to supplement infant at the breast with next feeding, mom agreeable.  Plan: BF infant 8-12 x in 24 hours at first feeding cues using # 24 NS Supplement infant with EBM at breast with SNS or with syringe post BF Pump for 15 minutes with DEBP Store milk per quidelines Call for Lactation/RN assistance as needed.   Will need to assist at next feeding to place SNS.    Maternal Data Has patient been taught Hand Expression?: Yes Does the patient have breastfeeding experience prior to this delivery?: No  Feeding Feeding Type: Breast Fed Nipple Type: Slow - flow Length of feed: 45 min (20 minutes at breast)  LATCH Score/Interventions Latch: Repeated attempts needed to sustain latch, nipple held in mouth throughout feeding, stimulation needed to elicit sucking reflex. Intervention(s): Adjust position;Assist with latch;Breast massage;Breast compression  Audible Swallowing: A few with stimulation Intervention(s):  Skin to skin;Hand expression  Type of Nipple: Everted at rest and after stimulation  Comfort (Breast/Nipple): Soft / non-tender     Hold (Positioning): Assistance needed to correctly position infant at breast and maintain latch. Intervention(s): Breastfeeding basics reviewed;Support Pillows;Position options;Skin to skin  LATCH Score: 7  Lactation Tools Discussed/Used Tools: Nipple Shields Nipple shield size: 24 Breast pump type: Double-Electric Breast Pump Pump Review: Setup, frequency, and cleaning;Milk Storage   Consult Status Consult Status: Follow-up Date: 02/17/16 Follow-up type: In-patient    Silas FloodSharon S Bodhi Moradi 02/17/2016, 12:09 PM

## 2016-02-17 NOTE — Progress Notes (Signed)
Subjective: Postpartum Day 2: Cesarean Delivery Patient reports tolerating PO.  Baby in NICU  Objective: Vital signs in last 24 hours: Temp:  [97.7 F (36.5 C)-98 F (36.7 C)] 97.7 F (36.5 C) (05/19 0515) Pulse Rate:  [75-101] 75 (05/19 0515) Resp:  [18] 18 (05/19 0515) BP: (112-146)/(55-88) 112/55 mmHg (05/19 0515)  Physical Exam:  General: alert and cooperative Lochia: appropriate Uterine Fundus: firm Incision: healing well, no significant drainage DVT Evaluation: No evidence of DVT seen on physical exam.   Recent Labs  02/15/16 0551  HGB 9.4*  HCT 27.6*    Assessment/Plan: Status post Cesarean section. Doing well postoperatively.  Continue current care. Desires circ once discharge from NICU  Tilly Pernice 02/17/2016, 9:25 AM

## 2016-02-17 NOTE — Lactation Note (Signed)
This note was copied from a baby's chart. Lactation Consultation Note  Patient Name: Mary Buchanan ZOXWR'UToday's Date: 02/17/2016 Reason for consult: Follow-up assessment;NICU baby   Follow up with mom of 6964 hour old NICU Mom. Mom is pumping every 3 hours and is getting 30-75 cc / pumping. She reports infant is doing well and is now on q 3 hour ad lib feeds. . She reports she did try to BF him last night and she was too full and infant had difficulty latching on. Plan made to meet mom in NICU for 11 am feeding. Mom reports she is feeling fuller and breasts do soften with pumping. She reports she got 75 cc with pumping early this morning by pumping one breast at a time and massaging breast with pumping. Mom pleased that milk is coming in.    Maternal Data Does the patient have breastfeeding experience prior to this delivery?: No  Feeding Feeding Type: Formula Nipple Type: Slow - flow Length of feed: 30 min  LATCH Score/Interventions                      Lactation Tools Discussed/Used Pump Review: Setup, frequency, and cleaning;Milk Storage   Consult Status Consult Status: Follow-up Date: 02/17/16 Follow-up type: In-patient    Silas FloodSharon S Hakiem Malizia 02/17/2016, 9:33 AM

## 2016-02-18 MED ORDER — OXYCODONE-ACETAMINOPHEN 5-325 MG PO TABS: 1.0000 | ORAL_TABLET | Freq: Four times a day (QID) | ORAL | Status: AC | PRN

## 2016-02-18 MED ORDER — IBUPROFEN 600 MG PO TABS: 600.0000 mg | ORAL_TABLET | Freq: Four times a day (QID) | ORAL | Status: AC | PRN

## 2016-02-18 MED ORDER — LABETALOL HCL 100 MG PO TABS: 100.0000 mg | ORAL_TABLET | Freq: Two times a day (BID) | ORAL | Status: AC

## 2016-02-18 NOTE — Progress Notes (Signed)
Subjective: Postpartum Day 4: Cesarean Delivery Patient reports incisional pain, tolerating PO and no problems voiding.    Objective: Vital signs in last 24 hours: Temp:  [97.6 F (36.4 C)-98.8 F (37.1 C)] 98.3 F (36.8 C) (05/20 0550) Pulse Rate:  [75-101] 75 (05/20 0550) Resp:  [18-19] 18 (05/20 0550) BP: (105-130)/(55-74) 105/55 mmHg (05/20 0550) SpO2:  [100 %] 100 % (05/19 1800)  Physical Exam:  General: alert, cooperative and appears stated age Lochia: appropriate Uterine Fundus: firm Incision: no significant drainage DVT Evaluation: No evidence of DVT seen on physical exam.  No results for input(s): HGB, HCT in the last 72 hours.  Assessment/Plan: Status post Cesarean section. Doing well postoperatively.  Discharge home with standard precautions and return to clinic in 1 weeks. Continue labetalol at 100mg /d  Tametha Banning 02/18/2016, 9:18 AM

## 2016-02-18 NOTE — Lactation Note (Signed)
This note was copied from a baby's chart. Lactation Consultation Note  Mother is still having difficulty latching and baby staying awake for feeding at the breast. She has been pumping up to 90 ml of breastmilk and giving to baby in bottle. Sugges she call LC for assistance w/ next feeding. She has been using #24NS and 5 FR to latch prefilled w/ pumped breastmilk. Reviewed engorgement care and monitoring voids/stools. Discussed waking techniques. Offered OP appt.  [facility] [facility] [place] [facility] and she plans to make OP appt there.  Patient Name: Boy Julaine Huamanda Rickel ZOXWR'UToday's Date: 02/18/2016 Reason for consult: Follow-up assessment   Maternal Data    Feeding Feeding Type: Breast Milk  LATCH Score/Interventions                      Lactation Tools Discussed/Used Tools: Nipple Shields;52F feeding tube / Syringe Nipple shield size: 24 Breast pump type: Double-Electric Breast Pump   Consult Status Consult Status: Complete    Hardie PulleyBerkelhammer, Alzina Golda Boschen 02/18/2016, 9:01 AM

## 2016-02-19 NOTE — Discharge Summary (Signed)
Obstetric Discharge Summary Reason for Admission: induction of labor Prenatal Procedures: ultrasound Intrapartum Procedures: LTCD Postpartum Procedures: none Complications-Operative and Postpartum: none, chronic HTN HEMOGLOBIN  Date Value Ref Range Status  02/15/2016 9.4* 12.0 - 15.0 g/dL Final    Comment:    DELTA CHECK NOTED REPEATED TO VERIFY    HCT  Date Value Ref Range Status  02/15/2016 27.6* 36.0 - 46.0 % Final    Physical Exam:  General: alert and cooperative Lochia: appropriate Uterine Fundus: firm Incision: healing well, no significant drainage DVT Evaluation: No evidence of DVT seen on physical exam.  Discharge Diagnoses: Term Pregnancy-delivered  Discharge Information: Date: 02/19/2016 Activity: pelvic rest Diet: routine Medications: PNV, Ibuprofen and Percocet Condition: stable Instructions: refer to practice specific booklet Discharge to: home   Newborn Data: Live born female  Birth Weight: 6 lb 12.1 oz (3065 g) APGAR: 8, 9  Home with mother.  Mary Buchanan 02/19/2016, 9:10 AM

## 2018-04-09 ENCOUNTER — Other Ambulatory Visit: Payer: Self-pay | Admitting: Obstetrics and Gynecology

## 2018-04-09 DIAGNOSIS — N6320 Unspecified lump in the left breast, unspecified quadrant: Secondary | ICD-10-CM

## 2018-04-11 ENCOUNTER — Ambulatory Visit
Admission: RE | Admit: 2018-04-11 | Discharge: 2018-04-11 | Disposition: A | Discharge status: Home / Self Care | Payer: BLUE CROSS/BLUE SHIELD | Source: Ambulatory Visit | Attending: Obstetrics and Gynecology | Admitting: Obstetrics and Gynecology

## 2018-04-11 DIAGNOSIS — N6320 Unspecified lump in the left breast, unspecified quadrant: Secondary | ICD-10-CM

## 2019-06-23 ENCOUNTER — Encounter: Payer: Self-pay | Admitting: Gynecology

## 2019-06-30 ENCOUNTER — Other Ambulatory Visit: Payer: Self-pay | Admitting: Certified Nurse Midwife

## 2019-06-30 DIAGNOSIS — Z1231 Encounter for screening mammogram for malignant neoplasm of breast: Secondary | ICD-10-CM

## 2019-08-03 ENCOUNTER — Ambulatory Visit
Admission: RE | Admit: 2019-08-03 | Discharge: 2019-08-03 | Disposition: A | Discharge status: Home / Self Care | Payer: BC Managed Care – PPO | Source: Ambulatory Visit | Attending: Certified Nurse Midwife | Admitting: Certified Nurse Midwife

## 2019-08-03 ENCOUNTER — Encounter: Payer: Self-pay | Admitting: Radiology

## 2019-08-03 DIAGNOSIS — Z1231 Encounter for screening mammogram for malignant neoplasm of breast: Secondary | ICD-10-CM

## 2020-04-10 IMAGING — MG DIGITAL SCREENING BILAT W/ TOMO
8 series · 8 of 24 positions shown · non-contrast
Comparison: Previous exam(s).

CLINICAL DATA: Screening.

EXAM:
DIGITAL SCREENING BILATERAL MAMMOGRAM WITH TOMO AND CAD

[L CC synth-2D]
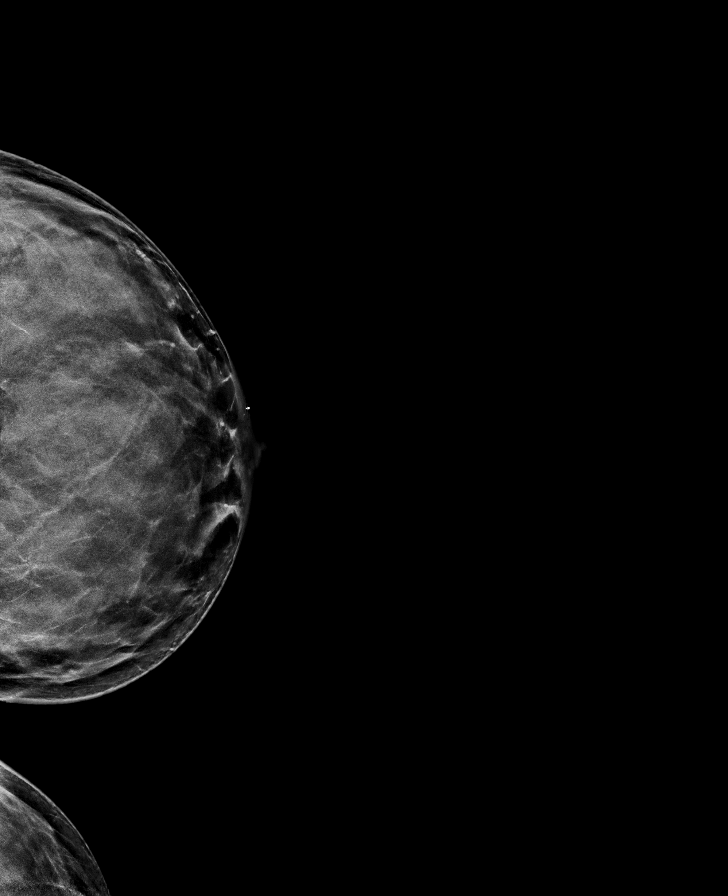

[L MLO synth-2D]
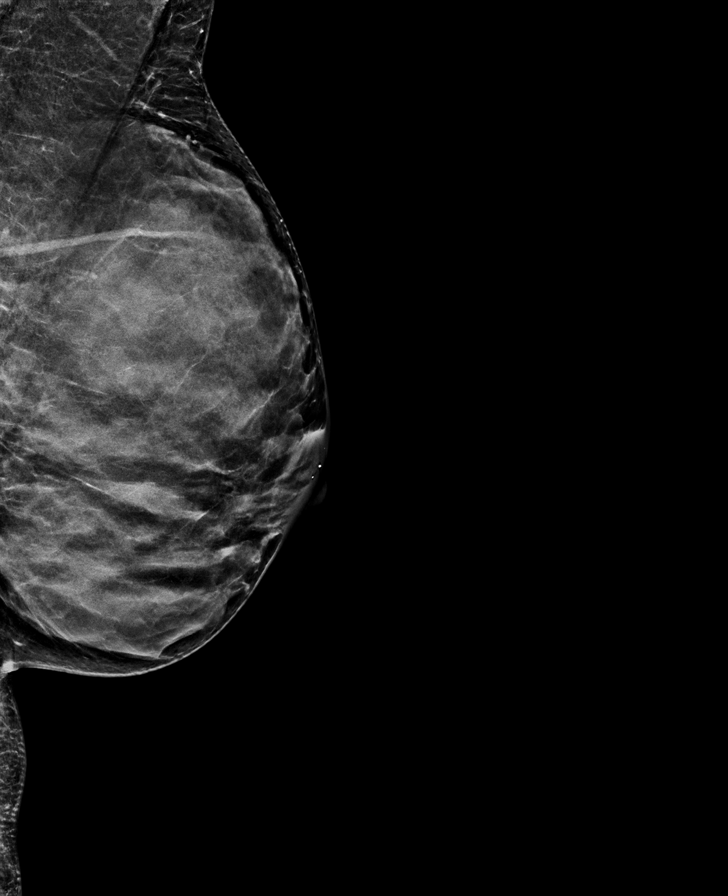

[R MLO synth-2D]
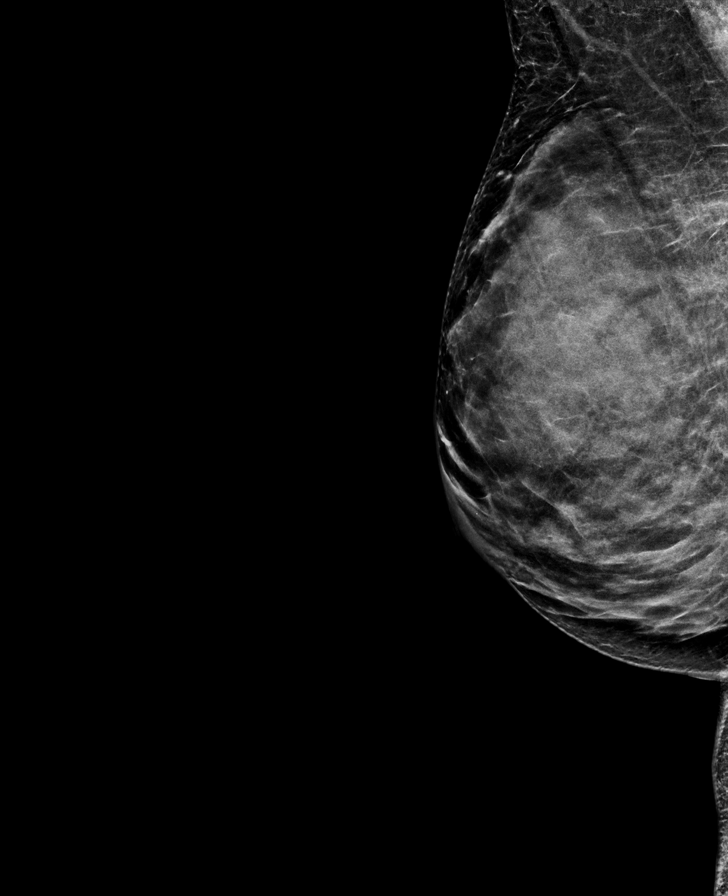

[R CC synth-2D]
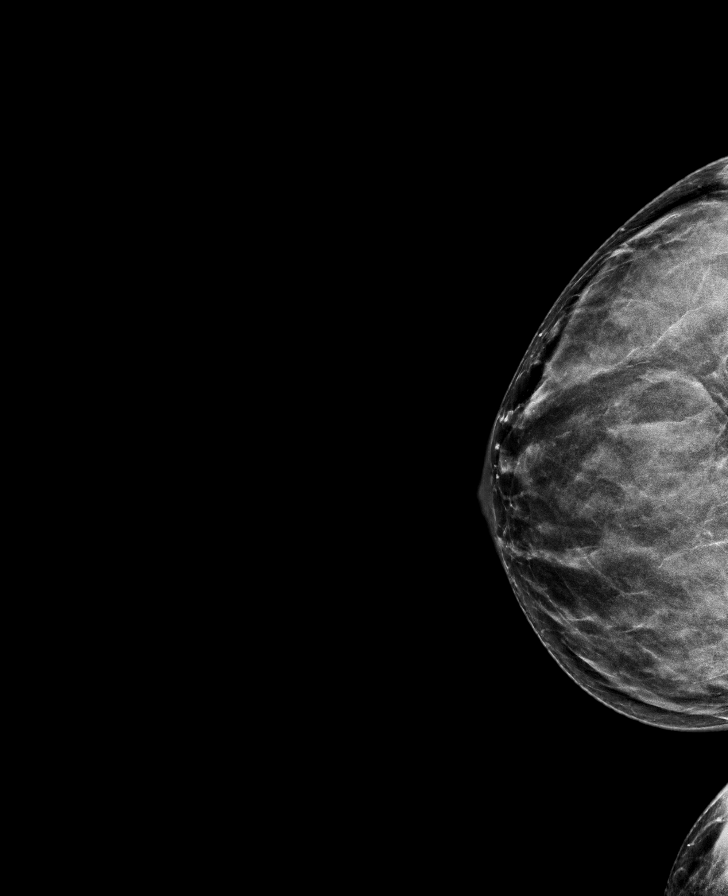

[L MLO tomo · tomo slice 27/54.0]
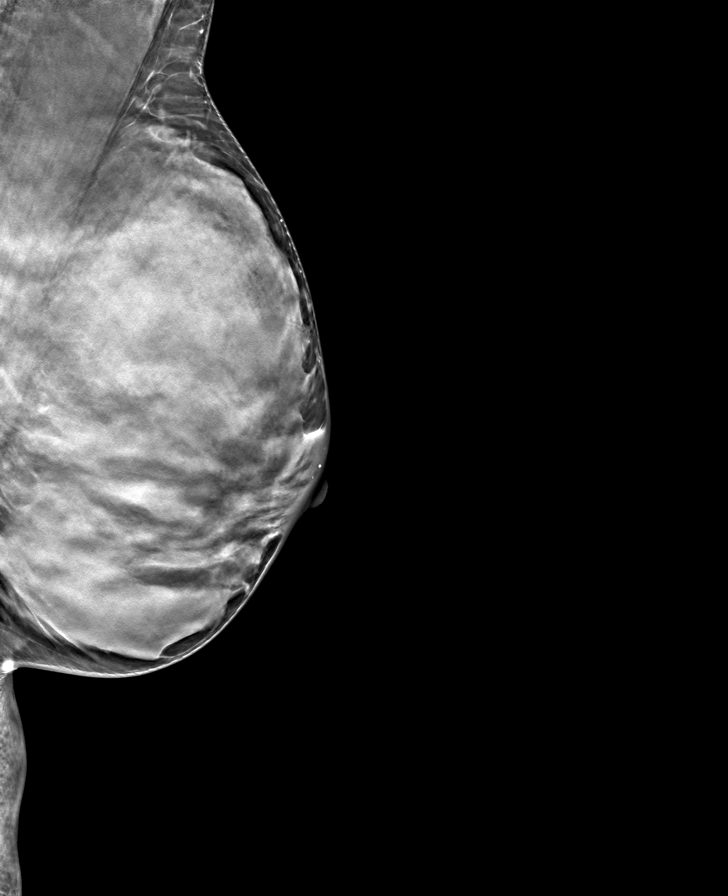

[R MLO tomo · tomo slice 28/55.0]
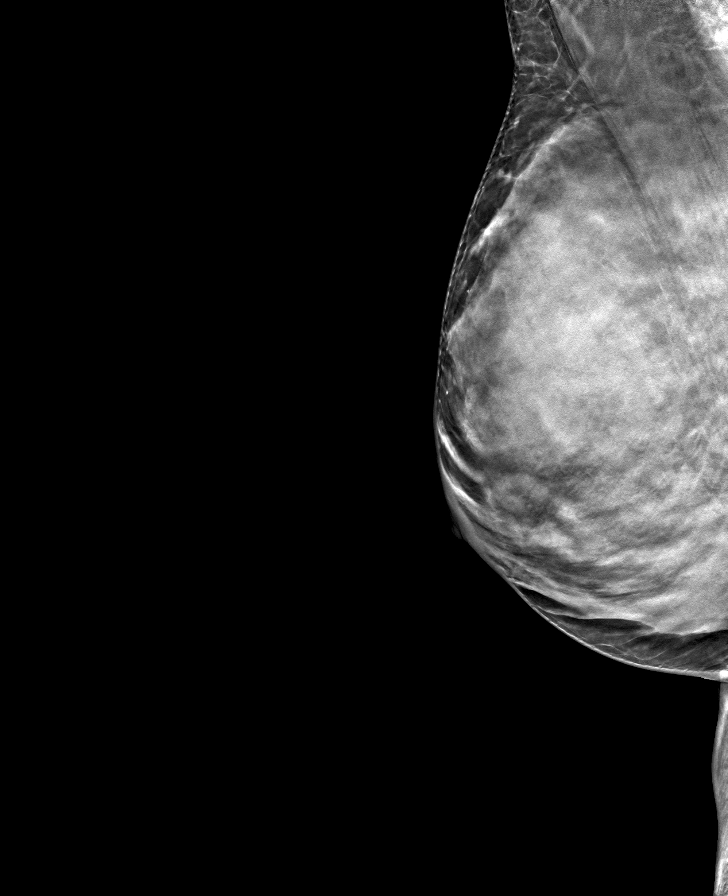

[R CC tomo · tomo slice 31/61.0]
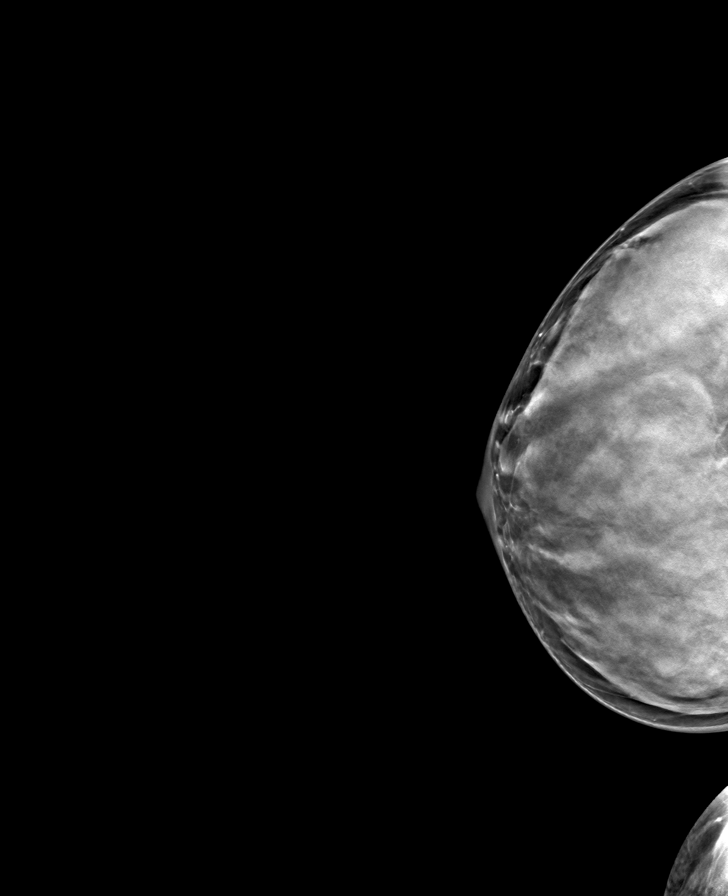

[L CC tomo · tomo slice 25/50.0]
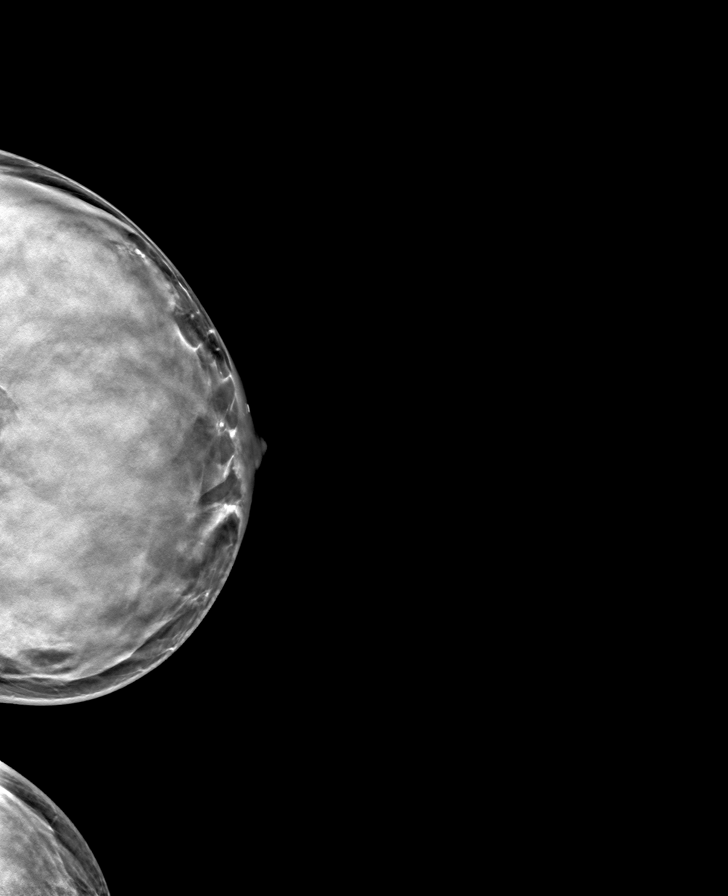

[8 of 24 positions shown; findings below may reference images not displayed]

ACR Breast Density Category d: The breast tissue is extremely dense,
which lowers the sensitivity of mammography
FINDINGS: There are no findings suspicious for malignancy. Images were
processed with CAD.
IMPRESSION: No mammographic evidence of malignancy. A result letter of this
screening mammogram will be mailed directly to the patient.

RECOMMENDATION:
Screening mammogram in one year. (Code:WO-0-ZI0)

BI-RADS CATEGORY  1: Negative.

## 2021-07-25 ENCOUNTER — Other Ambulatory Visit: Payer: Self-pay | Admitting: Internal Medicine

## 2021-07-25 DIAGNOSIS — Z1231 Encounter for screening mammogram for malignant neoplasm of breast: Secondary | ICD-10-CM

## 2021-08-15 ENCOUNTER — Ambulatory Visit
Admission: RE | Admit: 2021-08-15 | Discharge: 2021-08-15 | Disposition: A | Discharge status: Home / Self Care | Payer: BC Managed Care – PPO | Source: Ambulatory Visit | Attending: Internal Medicine | Admitting: Internal Medicine

## 2021-08-15 ENCOUNTER — Other Ambulatory Visit: Payer: Self-pay

## 2021-08-15 DIAGNOSIS — Z1231 Encounter for screening mammogram for malignant neoplasm of breast: Secondary | ICD-10-CM | POA: Insufficient documentation

## 2022-08-10 ENCOUNTER — Other Ambulatory Visit: Payer: Self-pay | Admitting: Internal Medicine

## 2022-08-10 DIAGNOSIS — Z1231 Encounter for screening mammogram for malignant neoplasm of breast: Secondary | ICD-10-CM

## 2022-09-12 ENCOUNTER — Ambulatory Visit
Admission: RE | Admit: 2022-09-12 | Discharge: 2022-09-12 | Disposition: A | Discharge status: Home / Self Care | Payer: BC Managed Care – PPO | Source: Ambulatory Visit | Attending: Internal Medicine | Admitting: Internal Medicine

## 2022-09-12 DIAGNOSIS — Z1231 Encounter for screening mammogram for malignant neoplasm of breast: Secondary | ICD-10-CM | POA: Insufficient documentation

## 2022-09-17 ENCOUNTER — Other Ambulatory Visit: Payer: Self-pay | Admitting: Physician Assistant

## 2022-09-17 DIAGNOSIS — R928 Other abnormal and inconclusive findings on diagnostic imaging of breast: Secondary | ICD-10-CM

## 2022-09-17 DIAGNOSIS — N6489 Other specified disorders of breast: Secondary | ICD-10-CM

## 2022-09-26 ENCOUNTER — Ambulatory Visit
Admission: RE | Admit: 2022-09-26 | Discharge: 2022-09-26 | Disposition: A | Discharge status: Home / Self Care | Payer: BC Managed Care – PPO | Source: Ambulatory Visit | Attending: Physician Assistant | Admitting: Physician Assistant

## 2022-09-26 DIAGNOSIS — N6489 Other specified disorders of breast: Secondary | ICD-10-CM

## 2022-09-26 DIAGNOSIS — R928 Other abnormal and inconclusive findings on diagnostic imaging of breast: Secondary | ICD-10-CM

## 2023-09-13 ENCOUNTER — Other Ambulatory Visit: Payer: Self-pay | Admitting: Internal Medicine

## 2023-09-13 DIAGNOSIS — Z1231 Encounter for screening mammogram for malignant neoplasm of breast: Secondary | ICD-10-CM

## 2023-10-01 ENCOUNTER — Ambulatory Visit
Admission: RE | Admit: 2023-10-01 | Discharge: 2023-10-01 | Disposition: A | Discharge status: Home / Self Care | Payer: BC Managed Care – PPO | Source: Ambulatory Visit | Attending: Internal Medicine | Admitting: Internal Medicine

## 2023-10-01 DIAGNOSIS — Z1231 Encounter for screening mammogram for malignant neoplasm of breast: Secondary | ICD-10-CM | POA: Insufficient documentation

## 2023-10-08 ENCOUNTER — Other Ambulatory Visit: Payer: Self-pay | Admitting: Internal Medicine

## 2023-10-08 DIAGNOSIS — R928 Other abnormal and inconclusive findings on diagnostic imaging of breast: Secondary | ICD-10-CM

## 2023-10-09 ENCOUNTER — Other Ambulatory Visit: Payer: Self-pay | Admitting: Internal Medicine

## 2023-10-09 ENCOUNTER — Ambulatory Visit
Admission: RE | Admit: 2023-10-09 | Discharge: 2023-10-09 | Disposition: A | Discharge status: Home / Self Care | Payer: BC Managed Care – PPO | Source: Ambulatory Visit | Attending: Internal Medicine | Admitting: Internal Medicine

## 2023-10-09 DIAGNOSIS — R928 Other abnormal and inconclusive findings on diagnostic imaging of breast: Secondary | ICD-10-CM
